# Patient Record
Sex: Female | Born: 1990 | Race: Black or African American
Health system: Southern US, Community
[De-identification: ages and names within clinical notes are randomized; demographics above are authoritative.]

## PROBLEM LIST (undated history)

## (undated) DIAGNOSIS — G43019 Migraine without aura, intractable, without status migrainosus: Principal | ICD-10-CM

## (undated) HISTORY — PX: WISDOM TOOTH EXTRACTION: SHX21

## (undated) HISTORY — DX: Migraine without aura, intractable, without status migrainosus: G43.019

---

## 2012-07-23 DIAGNOSIS — N946 Dysmenorrhea, unspecified: Secondary | ICD-10-CM | POA: Insufficient documentation

## 2012-08-19 DIAGNOSIS — J309 Allergic rhinitis, unspecified: Secondary | ICD-10-CM | POA: Insufficient documentation

## 2012-09-24 DIAGNOSIS — R635 Abnormal weight gain: Secondary | ICD-10-CM | POA: Insufficient documentation

## 2012-09-24 DIAGNOSIS — E669 Obesity, unspecified: Secondary | ICD-10-CM | POA: Insufficient documentation

## 2015-04-13 ENCOUNTER — Encounter (HOSPITAL_COMMUNITY): Payer: Self-pay

## 2015-04-13 ENCOUNTER — Emergency Department (HOSPITAL_COMMUNITY)
Admission: EM | Admit: 2015-04-13 | Discharge: 2015-04-13 | Disposition: A | Discharge status: Home / Self Care | Payer: Managed Care, Other (non HMO) | Attending: Emergency Medicine | Admitting: Emergency Medicine

## 2015-04-13 DIAGNOSIS — R519 Headache, unspecified: Secondary | ICD-10-CM

## 2015-04-13 DIAGNOSIS — Z79899 Other long term (current) drug therapy: Secondary | ICD-10-CM | POA: Insufficient documentation

## 2015-04-13 DIAGNOSIS — R42 Dizziness and giddiness: Secondary | ICD-10-CM | POA: Diagnosis not present

## 2015-04-13 DIAGNOSIS — R51 Headache: Secondary | ICD-10-CM | POA: Insufficient documentation

## 2015-04-13 DIAGNOSIS — R110 Nausea: Secondary | ICD-10-CM | POA: Diagnosis not present

## 2015-04-13 DIAGNOSIS — Z3202 Encounter for pregnancy test, result negative: Secondary | ICD-10-CM | POA: Diagnosis not present

## 2015-04-13 LAB — I-STAT BETA HCG BLOOD, ED (MC, WL, AP ONLY): I-stat hCG, quantitative: <5 m[IU]/mL (ref <5)

## 2015-04-13 MED ORDER — PROCHLORPERAZINE MALEATE 10 MG PO TABS: 10.0000 mg | ORAL_TABLET | Freq: Two times a day (BID) | ORAL | Status: DC | PRN

## 2015-04-13 MED ORDER — MAGNESIUM SULFATE 2 GM/50ML IV SOLN
2.0000 g | Freq: Once | INTRAVENOUS | Status: AC
  Administered 2015-04-13: 2 g via INTRAVENOUS
  Filled 2015-04-13: qty 50

## 2015-04-13 MED ORDER — PROCHLORPERAZINE EDISYLATE 5 MG/ML IJ SOLN
10.0000 mg | Freq: Once | INTRAMUSCULAR | Status: AC
  Administered 2015-04-13: 10 mg via INTRAVENOUS
  Filled 2015-04-13: qty 2

## 2015-04-13 MED ORDER — METHYLPREDNISOLONE SODIUM SUCC 125 MG IJ SOLR
125.0000 mg | Freq: Once | INTRAMUSCULAR | Status: AC
  Administered 2015-04-13: 125 mg via INTRAVENOUS
  Filled 2015-04-13: qty 2

## 2015-04-13 MED ORDER — SODIUM CHLORIDE 0.9 % IV BOLUS (SEPSIS)
1000.0000 mL | Freq: Once | INTRAVENOUS | Status: AC
  Administered 2015-04-13: 1000 mL via INTRAVENOUS

## 2015-04-13 NOTE — ED Notes (Addendum)
Since Monday, pt has had headache.  No hx of migraines.  Pt has been nauseated.  Using ibuprofen with no relief.  Not sleeping.  Pt went to urgent care and told the things she would recommend haven't work so told her to come here.

## 2015-04-13 NOTE — ED Provider Notes (Signed)
CSN: [id number]     Arrival date & time 04/13/15  1112 History   First MD Initiated Contact with Patient 04/13/15 1633     Chief Complaint  Patient presents with  . Headache     (Consider location/radiation/quality/duration/timing/severity/associated sxs/prior Treatment) HPI   Blood pressure 130/104, pulse 70, temperature 98.4 F (36.9 C), temperature source Oral, resp. rate 20, last menstrual period 04/06/2015, SpO2 97 %.  Joan Lee is a 25 y.o. female complaining of eadache for the last two days. HA is a constant, right-sided, throbbing pain. Pain radiates to right ear/jaw. Admits to phonophobia, nausea, dizziness and worse pain with movement. Denies photophobia or vomiting. Pt has a history of mild headaches that improve immediately with ibuprofen and rest. She states this episode feels similar, but more severe and did not dissipate with ibuprofen.  Family history of migraines. Pt denies fever, rash, confusion, cervicalgia, LOC/syncope, change in vision,numbness, weakness, dysarthria, ataxia, thunderclap onset, exacerbation with exertion or valsalva, exacerbation in morning, CP, SOB, abdominal pain.   History reviewed. No pertinent past medical history. History reviewed. No pertinent past surgical history. History reviewed. No pertinent family history. Social History  Substance Use Topics  . Smoking status: Never Smoker   . Smokeless tobacco: None  . Alcohol Use: No   OB History    No data available     Review of Systems  10 systems reviewed and found to be negative, except as noted in the HPI.   Allergies  Review of patient's allergies indicates no known allergies.  Home Medications   Prior to Admission medications   Medication Sig Start Date End Date Taking? Authorizing Provider  IBUPROFEN PO Take by mouth every 6 (six) hours as needed (headache).   Yes Historical Provider, MD  TRI-SPRINTEC 0.18/0.215/0.25 MG-35 MCG tablet Take 1 tablet by mouth daily. 04/09/15   Yes Historical Provider, MD   BP 130/104 mmHg  Pulse 70  Temp(Src) 98.4 F (36.9 C) (Oral)  Resp 20  SpO2 97%  LMP 04/06/2015 Physical Exam  Constitutional: She is oriented to person, place, and time. She appears well-developed and well-nourished.  HENT:  Head: Normocephalic and atraumatic.  Mouth/Throat: Oropharynx is clear and moist.  Eyes: Conjunctivae and EOM are normal. Pupils are equal, round, and reactive to light.  No TTP of maxillary or frontal sinuses  No TTP or induration of temporal arteries bilaterally  Neck: Normal range of motion. Neck supple.  FROM to C-spine. Pt can touch chin to chest without discomfort. No TTP of midline cervical spine.   Cardiovascular: Normal rate, regular rhythm and intact distal pulses.   Pulmonary/Chest: Effort normal and breath sounds normal. No respiratory distress. She has no wheezes. She has no rales. She exhibits no tenderness.  Abdominal: Soft. Bowel sounds are normal. There is no tenderness.  Musculoskeletal: Normal range of motion. She exhibits no edema or tenderness.  Neurological: She is alert and oriented to person, place, and time. No cranial nerve deficit.  II-Visual fields grossly intact. III/IV/VI-Extraocular movements intact.  Pupils reactive bilaterally. V/VII-Smile symmetric, equal eyebrow raise,  facial sensation intact VIII- Hearing grossly intact IX/X-Normal gag XI-bilateral shoulder shrug XII-midline tongue extension Motor: 5/5 bilaterally with normal tone and bulk Cerebellar: Normal finger-to-nose  and normal heel-to-shin test.   Romberg negative Ambulates with a coordinated gait   Nursing note and vitals reviewed.   ED Course  Procedures (including critical care time) Labs Review Labs Reviewed  I-STAT BETA HCG BLOOD, ED (MC, [facility], AP ONLY)   Imaging  Review No results found. I have personally reviewed and evaluated these images and lab results as part of my medical decision-making.   EKG  Interpretation None      MDM   Final diagnoses:  Acute nonintractable headache, unspecified headache type    Filed Vitals:   04/13/15 1153 04/13/15 1555 04/13/15 1824  BP: 161/92 130/104 123/80  Pulse: 76 70 61  Temp: 98.8 F (37.1 C) 98.4 F (36.9 C) 98.6 F (37 C)  TempSrc: Oral Oral Oral  Resp: SpO2: 100% 97% 100%    Medications  sodium chloride 0.9 % bolus 1,000 mL (1,000 mLs Intravenous New Bag/Given 04/13/15 1831)  magnesium sulfate IVPB 2 g 50 mL (2 g Intravenous New Bag/Given 04/13/15 1832)  prochlorperazine (COMPAZINE) injection 10 mg (10 mg Intravenous Given 04/13/15 1832)  methylPREDNISolone sodium succinate (SOLU-MEDROL) 125 mg/2 mL injection 125 mg (125 mg Intravenous Given 04/13/15 1832)    Joan Lee is 25 y.o. female presenting with HA. Presentation is like pts typical HA except not fully alleviated with ibuprofen. HA non concerning for [facility], ICH, Meningitis, or temporal arteritis. Pt is afebrile with no focal neuro deficits, nuchal rigidity, or change in vision. Pt is to follow up with PCP to discuss prophylactic medication. Pt verbalizes understanding and is agreeable with plan to dc.  Evaluation does not show pathology that would require ongoing emergent intervention or inpatient treatment. Pt is hemodynamically stable and mentating appropriately. Discussed findings and plan with patient/guardian, who agrees with care plan. All questions answered. Return precautions discussed and outpatient follow up given.   New Prescriptions   PROCHLORPERAZINE (COMPAZINE) 10 MG TABLET    Take 1 tablet (10 mg total) by mouth 2 (two) times daily as needed (Headcahe).         Wynetta Emery, PA-C 04/13/15 1915  Azalia Bilis, MD 04/14/15 [phone]

## 2015-04-13 NOTE — Discharge Instructions (Signed)
For pain control please take ibuprofen (also known as Motrin or Advil) 800mg  (this is normally 4 over the counter pills) 3 times a day  for 5 days. Take with food to minimize stomach irritation.  Take Compazine as needed for breakthrough pain.  Do not hesitate to return to the emergency room for any new, worsening or concerning symptoms.  Please obtain primary care using resource guide below. Let them know that you were seen in the emergency room and that they will need to obtain records for further outpatient management.   General Headache Without Cause A headache is pain or discomfort felt around the head or neck area. There are many causes and types of headaches. In some cases, the cause may not be found.  HOME CARE  Managing Pain  Take over-the-counter and prescription medicines only as told by your doctor.  Lie down in a dark, quiet room when you have a headache.  If directed, apply ice to the head and neck area:  Put ice in a plastic bag.  Place a towel between your skin and the bag.  Leave the ice on for 20 minutes, 2-3 times per day.  Use a heating pad or hot shower to apply heat to the head and neck area as told by your doctor.  Keep lights dim if bright lights bother you or make your headaches worse. Eating and Drinking  Eat meals on a regular schedule.  Lessen how much alcohol you drink.  Lessen how much caffeine you drink, or stop drinking caffeine. General Instructions  Keep all follow-up visits as told by your doctor. This is important.  Keep a journal to find out if certain things bring on headaches. For example, write down:  What you eat and drink.  How much sleep you get.  Any change to your diet or medicines.  Relax by getting a massage or doing other relaxing activities.  Lessen stress.  Sit up straight. Do not tighten (tense) your muscles.  Do not use tobacco products. This includes cigarettes, chewing tobacco, or e-cigarettes. If you need  help quitting, ask your doctor.  Exercise regularly as told by your doctor.  Get enough sleep. This often means 7-9 hours of sleep. GET HELP IF:  Your symptoms are not helped by medicine.  You have a headache that feels different than the other headaches.  You feel sick to your stomach (nauseous) or you throw up (vomit).  You have a fever. GET HELP RIGHT AWAY IF:   Your headache becomes really bad.  You keep throwing up.  You have a stiff neck.  You have trouble seeing.  You have trouble speaking.  You have pain in the eye or ear.  Your muscles are weak or you lose muscle control.  You lose your balance or have trouble walking.  You feel like you will pass out (faint) or you pass out.  You have confusion.   This information is not intended to replace advice given to you by your health care provider. Make sure you discuss any questions you have with your health care provider.   Document Released: 11/22/2007 Document Revised: 11/03/2014 Document Reviewed: 06/07/2014 Elsevier Interactive Patient Education 2016 [employer].   Emergency Department Resource Guide 1) Find a Doctor and Pay Out of Pocket Although you won't have to find out who is covered by your insurance plan, it is a good idea to ask around and get recommendations. You will then need to call the office and see if  the doctor you have chosen will accept you as a new patient and what types of options they offer for patients who are self-pay. Some doctors offer discounts or will set up payment plans for their patients who do not have insurance, but you will need to ask so you aren't surprised when you get to your appointment.  2) Contact Your Local Health Department Not all health departments have doctors that can see patients for sick visits, but many do, so it is worth a call to see if yours does. If you don't know where your local health department is, you can check in your phone book. The CDC also has a tool  to help you locate your state's health department, and many state websites also have listings of all of their local health departments.  3) Find a Walk-in Clinic If your illness is not likely to be very severe or complicated, you may want to try a walk in clinic. These are popping up all over the country in pharmacies, drugstores, and shopping centers. They're usually staffed by nurse practitioners or physician assistants that have been trained to treat common illnesses and complaints. They're usually fairly quick and inexpensive. However, if you have serious medical issues or chronic medical problems, these are probably not your best option.  No Primary Care Doctor: - Call Health Connect at  [phone] - they can help you locate a primary care doctor that  accepts your insurance, provides certain services, etc. - Physician Referral Service- [phone]  Chronic Pain Problems: Organization         Address  Phone   Notes  [facility]  [phone] Patients need to be referred by their primary care doctor.   Medication Assistance: Organization         Address  Phone   Notes  [facility] Med[facility] [address] [place], [place] [address] [phone] --Must be a resident of [facility] -- Must have NO insurance coverage whatsoever (no Medicaid/ Medicare, etc.) -- The pt. MUST have a primary care doctor that directs their care regularly and follows them in the community   [facility]ist  [phone]   [employer]  [phone]    Agencies that provide inexpensive medical care: Organization         Address  Phone   Notes  [facility]  [phone]   [facility]    [phone]   [facility] [address] [place], [place] [address] [phone]   [facility] [address] [place], [place] [phone]   Planned Parenthood    [phone]    [facility]    [phone]   [facility]  [address], [place] Phone:  [phone], Fax:  [phone] Hours of Operation:  9 am - 6 pm, M-F.  Also accepts Medicaid/Medicare and self-pay.  [facility]  [address], [place] Phone: [phone], Fax: [phone]. Hours of Operation:  8:30 am - 5:30 pm, M-F.  Also accepts Medicaid and self-pay.  [facility] [place] [address] [place] Phone: [phone]   [facility] [address] Natasha Bence [place], [place] [phone], Ext. [phone] Mondays & Thursdays: 7-9 AM.  First 15 patients are seen on a first come, first serve basis.    Medicaid-accepting [facility] Providers:  Organization         Address  Phone   Notes  [facility] [address], [place] [phone] Also accepts self-pay patients.  [facility] [address] Laurell Josephs [place], [place]  [phone]   [facility] [address], [place] [phone]   [facility] [address] [place] [phone]   Renaye Rakers [address], [place]   [phone] Only accepts [place] patients after they have their name applied to their card.   Self-Pay (no insurance) in [facility]:  Organization         Address  Phone   Notes  Sickle Cell Patients, [facility] [address] [place], [place] [phone]   [facility] [address] [place], [place] [phone]   [facility]  [address], [place] [phone]   [facility]/Dr. Osei-Bonsu  [address] [place] or [address], [place] [phone] Phone number for both [place] and [place] locations is the same.  [facility] [address], [place] [phone]   [facility] [address], [place] or [address] [phone]   [facility] [address] [place] [phone], phone; [phone], fax Sees patients 1st and 3rd Saturday of every month.  Must not qualify for public or private insurance (i.e. Medicaid, Medicare, [place] Health Choice, Veterans' Benefits)  Household income should be no more than 200% of the poverty level The clinic cannot treat you if you are pregnant or think you are pregnant  Sexually transmitted diseases are not treated at the clinic.    Dental Care: Organization         Address  Phone  Notes  [facility] [place] [facility] Department of [facility] [address] [place], [place] [phone] Accepts children up to age 32 who are enrolled in [place] or [place] Health Choice; pregnant women with a Medicaid card; and children who have applied for Medicaid or [place] Health Choice, but were declined, whose parents can pay a reduced fee at time of service.  [facility]  [address], [place] [phone] Accepts children up to age 84 who are enrolled in [place] or [place] Health Choice; pregnant women with a Medicaid card; and children who have applied for Medicaid or [place] Health Choice, but were declined, whose parents can pay a reduced fee at time of service.  [facility]  [address] [place], [place] [phone] Patients are seen by appointment only. Walk-ins are not accepted. [facility] will see patients 55 years of age and older. Monday - Tuesday (8am-5pm) Most Wednesdays (8:30-5pm) $30 per visit, cash only  [facility]  [address], [facility] [phone] Patients are seen by appointment only. Walk-ins are not accepted. [facility] will see patients 17 years of age and older. One Wednesday  Evening (Monthly: Volunteer Based).  $30 per visit, cash only  [employer]  [phone] for adults; Children under age 36, call Graduate Pediatric Dentistry at [phone]. Children aged 42-14, please call [phone] to request a pediatric application.  Dental services  are provided in all areas of dental care including fillings, crowns and bridges, complete and partial dentures, implants, gum treatment, root canals, and extractions. Preventive care is also provided. Treatment is provided to both adults and children. Patients are selected via a lottery and there is often a waiting list.   [facility] [address] [place]  [phone] [email or web address]   [facility] [address] [place], [place] [phone], Ext. [phone] Second and Fourth Thursday of each month, opens at 6:30 AM; Clinic ends at 9 AM.  Patients are seen on a first-come first-served basis, and a limited number are seen during each clinic.   [facility]  [address] Ether Griffins [place], [place] [phone]   Eligibility Requirements You must have lived in [place], [place], or [place] counties for at least the last three months.   You cannot be eligible for state or federal sponsored [place], including [employer], [place], or [employer].   You generally cannot be eligible for healthcare insurance through your employer.    How to apply: Eligibility screenings are held every Tuesday and Wednesday afternoon from 1:00 pm until 4:00 pm. You do not need an appointment for the interview!  [facility] [address] [place], [place] [phone]   [facility] Health Department  [phone]   [facility] Health Department  [phone]   [facility] Health Department  [phone]    Behavioral Health Resources in the Community: Intensive Outpatient Programs Organization         Address  Phone  Notes  [facility] [address] [place], [place] [phone]   [facility] Outpatient [address] [place], [place] [phone]   [facility] [address], [place], [place]  [phone]   [facility] [address],  [place], [place] [phone] or [phone]   Substance Abuse Resources Organization         Address  Phone  Notes  [facility] Drug Services  [phone]   [facility]  [phone]   [facility] [place]  [phone]   Floydene Flock  [phone]   Residential & Outpatient Substance Abuse Program  [phone]   Psychological Services Organization         Address  Phone  Notes  [facility] Behavioral Health  [phone]   [facility] Services  [phone]   [facility] [address], [place] [phone] or [phone]    Mobile Crisis Teams Organization         Address  Phone  Notes  [facility], Mobile Crisis Care Unit  [phone]   [facility]  [address] [place], [place] [phone]   Doristine Locks [address] [place] [phone]    Self-Help/Support Groups Organization         Address  Phone             Notes  [facility] [place] - variety of support groups  336- I7437963 Call for more information  Narcotics Anonymous (NA), [facility] [address], [employer] [place]  2 meetings at this location   Statistician         Address  Phone  Notes  [facility] [address],    [place]  [phone]   [facility]  [address] [place] [address], [place], [place] [phone]  [facility] [address] [place], [place] [phone] Admissions: 8am-3pm M-F  [facility] [address],    [place], [place] [phone]   [facility] [address] [place],  [place], [place] [phone]   [facility] [address]  [place], [place] [phone]   [facility] - Intensive Outpatient [address] Laurell Joseph[address], [place], [place] [phone]   [facility] ([facility] [address] [place]  [place], [place] [phone] or [phone]   [facility] ([facility]) [address], [place], [place] [phone] Accepts Medicaid  [facility] [address]  [place] [phone] Substance Abuse/Addiction Treatment   [facility] Organization         Address  Phone  Notes  [facility]  [phone]   Angie Fava, PhD [address] Ervin Knack [place], [place]   7208444835 or [phone]   [facility]   [address] [place], [place] [phone]   [facility] [address] [place], [place] [phone] Insurance/Medicaid/sponsorship through [facility] and Families [address]                                    [place], [place] [phone] Therapy/tele-psych/case  [facility] [address], [place] [phone]    Dr. Lolly Mustache  [phone]   [facility]  [employer] [facility] Dept. 1) [address] [place] 2) [address] [place] 3)  [address], [place] [phone]  [phone]   [facility] Child Abuse Hotline [phone] or [phone] (After Hours)

## 2015-04-14 ENCOUNTER — Telehealth: Payer: Self-pay | Admitting: *Deleted

## 2015-04-14 NOTE — Telephone Encounter (Signed)
Pharmacy called related to Rx: compazine  tablet prescribed for headahe .Marland KitchenMarland KitchenEDCM clarified with EDP, Rx to be filled as ordered for headache.

## 2015-04-20 ENCOUNTER — Ambulatory Visit (INDEPENDENT_AMBULATORY_CARE_PROVIDER_SITE_OTHER): Payer: Managed Care, Other (non HMO) | Admitting: Neurology

## 2015-04-20 ENCOUNTER — Encounter: Payer: Self-pay | Admitting: Neurology

## 2015-04-20 VITALS — BP 128/87 | HR 82 | Ht 67.5 in | Wt 264.5 lb

## 2015-04-20 DIAGNOSIS — G43019 Migraine without aura, intractable, without status migrainosus: Secondary | ICD-10-CM | POA: Diagnosis not present

## 2015-04-20 HISTORY — DX: Migraine without aura, intractable, without status migrainosus: G43.019

## 2015-04-20 MED ORDER — PREDNISONE 5 MG PO TABS: ORAL_TABLET | ORAL | Status: DC

## 2015-04-20 MED ORDER — RIZATRIPTAN BENZOATE 10 MG PO TABS: 10.0000 mg | ORAL_TABLET | Freq: Three times a day (TID) | ORAL | Status: DC | PRN

## 2015-04-20 MED ORDER — TOPIRAMATE 25 MG PO TABS: ORAL_TABLET | ORAL | Status: DC

## 2015-04-20 NOTE — Progress Notes (Signed)
Reason for visit: Headache  Referring physician: [place]  Joan Lee is a 25 y.o. female  History of present illness:  Joan Lee is a 25 year old right-handed black female with a history of headaches off and on throughout her life, she usually will have 2 or 3 headaches a month, easily controlled with over-the-counter medication such as Advil or Aleve. Ten days ago, the patient converted to having daily headaches that are generalized in nature and throbbing in quality. She may have some nausea, no vomiting with the headache. The patient denies any neck stiffness. She denies any confusion. She does have photophobia, not photophobia with the headache. She has gone to the emergency room, she was given medication that seemed to help temporarily for the headache, she was placed on Compazine for nausea, this did not help. She indicates that her mother also has headache. She denies any focal numbness or weakness of the face, arms, or legs. She denies any balance issues or difficulty controlling the bowels or the bladder. She is sent to this office for an evaluation.  Past Medical History  Diagnosis Date  . Common migraine with intractable migraine 04/20/2015    Past Surgical History  Procedure Laterality Date  . Wisdom tooth extraction      Family History  Problem Relation Age of Onset  . Diabetes Mother   . Migraines Mother   . Bell's palsy Father   . Bell's palsy Sister     Social history:  reports that she has never smoked. She has never used smokeless tobacco. She reports that she does not drink alcohol or use illicit drugs.  Medications:  Prior to Admission medications   Medication Sig Start Date End Date Taking? Authorizing Provider  IBUPROFEN PO Take by mouth every 6 (six) hours as needed (headache).   Yes Historical Provider, MD  TRI-SPRINTEC 0.18/0.215/0.25 MG-35 MCG tablet Take 1 tablet by mouth daily. 04/09/15  Yes Historical Provider, MD  prochlorperazine  (COMPAZINE) 10 MG tablet Take 1 tablet (10 mg total) by mouth 2 (two) times daily as needed (Headcahe). Patient not taking: Reported on 04/20/2015 04/13/15   Joni Reining Pisciotta, PA-C     No Known Allergies  ROS:  Out of a complete 14 system review of symptoms, the patient complains only of the following symptoms, and all other reviewed systems are negative.  Headache, dizziness Insomnia  Blood pressure 128/87, pulse 82, height 5' 7.5" (1.715 m), weight 264 lb 8 oz (119.976 kg), last menstrual period 04/06/2015.  Physical Exam  General: The patient is alert and cooperative at the time of the examination. The patient is moderately obese.  Eyes: Pupils are equal, round, and reactive to light. Discs are flat bilaterally.  Neck: The neck is supple, no carotid bruits are noted.  Respiratory: The respiratory examination is clear.  Cardiovascular: The cardiovascular examination reveals a regular rate and rhythm, no obvious murmurs or rubs are noted.  Neuromuscular: Range of movement of the cervical spine is full. No crepitus is noted in the temporomandibular joints  Skin: Extremities are without significant edema.  Neurologic Exam  Mental status: The patient is alert and oriented x 3 at the time of the examination. The patient has apparent normal recent and remote memory, with an apparently normal attention span and concentration ability.  Cranial nerves: Facial symmetry is present. There is good sensation of the face to pinprick and soft touch bilaterally. The strength of the facial muscles and the muscles to head turning and shoulder shrug  are normal bilaterally. Speech is well enunciated, no aphasia or dysarthria is noted. Extraocular movements are full. Visual fields are full. The tongue is midline, and the patient has symmetric elevation of the soft palate. No obvious hearing deficits are noted.  Motor: The motor testing reveals 5 over 5 strength of all 4 extremities. Good symmetric  motor tone is noted throughout.  Sensory: Sensory testing is intact to pinprick, soft touch, vibration sensation, and position sense on all 4 extremities. No evidence of extinction is noted.  Coordination: Cerebellar testing reveals good finger-nose-finger and heel-to-shin bilaterally.  Gait and station: Gait is normal. Tandem gait is normal. Romberg is negative. No drift is seen.  Reflexes: Deep tendon reflexes are symmetric and normal bilaterally. Toes are downgoing bilaterally.   Assessment/Plan:  1. Common migraine headache, converted migraine  The patient is now having daily headaches that are generalized in nature, throbbing. The patient will be placed on a prednisone Dosepak, 5 mg 6 day pack. Topamax will be started, and she was given a prescription for Maxalt. She will follow-up in 3 months, sooner if needed. She is to contact our office if the headaches continue, we may consider MRI evaluation of the brain. The patient has no primary care physician.  Marlan Palau MD 04/20/2015 7:07 PM  [facility] [address] [place], [place] [address]  Phone [phone] Fax [phone]

## 2015-04-20 NOTE — Patient Instructions (Addendum)
 Topamax (topiramate) is a seizure medication that has an FDA approval for seizures and for migraine headache. Potential side effects of this medication include weight loss, cognitive slowing, tingling in the fingers and toes, and carbonated drinks will taste bad. If any significant side effects are noted on this drug, please contact our office.  Migraine Headache A migraine headache is an intense, throbbing pain on one or both sides of your head. A migraine can last for 30 minutes to several hours. CAUSES  The exact cause of a migraine headache is not always known. However, a migraine may be caused when nerves in the brain become irritated and release chemicals that cause inflammation. This causes pain. Certain things may also trigger migraines, such as:  Alcohol.  Smoking.  Stress.  Menstruation.  Aged cheeses.  Foods or drinks that contain nitrates, glutamate, aspartame, or tyramine.  Lack of sleep.  Chocolate.  Caffeine.  Hunger.  Physical exertion.  Fatigue.  Medicines used to treat chest pain (nitroglycerine), birth control pills, estrogen, and some blood pressure medicines. SIGNS AND SYMPTOMS  Pain on one or both sides of your head.  Pulsating or throbbing pain.  Severe pain that prevents daily activities.  Pain that is aggravated by any physical activity.  Nausea, vomiting, or both.  Dizziness.  Pain with exposure to bright lights, loud noises, or activity.  General sensitivity to bright lights, loud noises, or smells. Before you get a migraine, you may get warning signs that a migraine is coming (aura). An aura may include:  Seeing flashing lights.  Seeing bright spots, halos, or zigzag lines.  Having tunnel vision or blurred vision.  Having feelings of numbness or tingling.  Having trouble talking.  Having muscle weakness. DIAGNOSIS  A migraine headache is often diagnosed based on:  Symptoms.  Physical exam.  A CT scan or MRI of your  head. These imaging tests cannot diagnose migraines, but they can help rule out other causes of headaches. TREATMENT Medicines may be given for pain and nausea. Medicines can also be given to help prevent recurrent migraines.  HOME CARE INSTRUCTIONS  Only take over-the-counter or prescription medicines for pain or discomfort as directed by your health care provider. The use of long-term narcotics is not recommended.  Lie down in a dark, quiet room when you have a migraine.  Keep a journal to find out what may trigger your migraine headaches. For example, write down:  What you eat and drink.  How much sleep you get.  Any change to your diet or medicines.  Limit alcohol consumption.  Quit smoking if you smoke.  Get 7-9 hours of sleep, or as recommended by your health care provider.  Limit stress.  Keep lights dim if bright lights bother you and make your migraines worse. SEEK IMMEDIATE MEDICAL CARE IF:   Your migraine becomes severe.  You have a fever.  You have a stiff neck.  You have vision loss.  You have muscular weakness or loss of muscle control.  You start losing your balance or have trouble walking.  You feel faint or pass out.  You have severe symptoms that are different from your first symptoms. MAKE SURE YOU:   Understand these instructions.  Will watch your condition.  Will get help right away if you are not doing well or get worse.   This information is not intended to replace advice given to you by your health care provider. Make sure you discuss any questions you have with your   health care provider.   Document Released: 02/12/2005 Document Revised: 03/05/2014 Document Reviewed: 10/20/2012 Elsevier Interactive Patient Education 2016 Elsevier Inc.  

## 2015-04-30 ENCOUNTER — Telehealth: Payer: Self-pay | Admitting: Neurology

## 2015-04-30 NOTE — Telephone Encounter (Signed)
Patient called. Thursday night took two topamax and since then she is haivng chest pain, throat itchy, coughing, chest tightness, warm fluid moving through her chest and a sharp pain an the last time she took it was Thursday night. Told her this is not a common side effect and she should go the ED or urgent care and be evaluated for an URI or flu or other cause.

## 2015-05-02 ENCOUNTER — Encounter (HOSPITAL_COMMUNITY): Payer: Self-pay | Admitting: Family Medicine

## 2015-05-02 ENCOUNTER — Emergency Department (HOSPITAL_COMMUNITY)
Admission: EM | Admit: 2015-05-02 | Discharge: 2015-05-02 | Disposition: A | Discharge status: Home / Self Care | Payer: Managed Care, Other (non HMO) | Attending: Emergency Medicine | Admitting: Emergency Medicine

## 2015-05-02 DIAGNOSIS — Z79899 Other long term (current) drug therapy: Secondary | ICD-10-CM | POA: Insufficient documentation

## 2015-05-02 DIAGNOSIS — J359 Chronic disease of tonsils and adenoids, unspecified: Secondary | ICD-10-CM | POA: Diagnosis not present

## 2015-05-02 DIAGNOSIS — Z8679 Personal history of other diseases of the circulatory system: Secondary | ICD-10-CM | POA: Insufficient documentation

## 2015-05-02 DIAGNOSIS — R079 Chest pain, unspecified: Secondary | ICD-10-CM | POA: Insufficient documentation

## 2015-05-02 DIAGNOSIS — R350 Frequency of micturition: Secondary | ICD-10-CM | POA: Insufficient documentation

## 2015-05-02 DIAGNOSIS — R1310 Dysphagia, unspecified: Secondary | ICD-10-CM | POA: Insufficient documentation

## 2015-05-02 DIAGNOSIS — J029 Acute pharyngitis, unspecified: Secondary | ICD-10-CM | POA: Insufficient documentation

## 2015-05-02 NOTE — Discharge Instructions (Signed)
STOP TAKING TAMIFLU AND WATCH FOR IMPROVEMENT IN SYMPTOMS. IF THERE IS WORSENING SENSATION OF DIFFICULTY SWALLOWING, TAKE BENADRYL (25-50 MG), AND RETURN TO THE EMERGENCY DEPARTMENT. IF SYMPTOMS PERSIST, FOLLOW UP WITH YOUR DOCTOR FOR FURTHER MANAGEMENT.

## 2015-05-02 NOTE — ED Notes (Addendum)
Patient reports she is experiencing an allergic reaction, having a burning in her chest and "at times she feels like her throat is closing up". Pt reports she started taking Bactrim and Tam-flu at 14:30 yesterday. At 17:00, she developed the previous symptoms. Pt is having regular, unlabored breathing. Talking in full sentences and able to swallow her salvia.

## 2015-05-02 NOTE — ED Provider Notes (Signed)
CSN: [id number]     Arrival date & time 05/02/15  0123 History   First MD Initiated Contact with Patient 05/02/15 0158     Chief Complaint  Patient presents with  . Allergic Reaction     (Consider location/radiation/quality/duration/timing/severity/associated sxs/prior Treatment) HPI Comments: Here with right sided chest pain x 1 day. She also complains of "throat closing" sensation. Seen by Urgent Care yesterday morning for evaluation of urinary frequency and fever, and diagnosed with UTI. She obtained a Rx for Bactrim for infection. She was also given Tamiflu for flu-like symptoms including sore throat (negative flu test and strep test at Urgent Care). Took a dose of each medication around 2:30 pm yesterday with current symptoms of chest discomfort and throat fullness starting around 5:00. She took a second dose around 9:00 with subsequent worsening of symptoms. No rash, vomiting, syncope, cough, persistent fever. She is able to manage her own secretions as well as swallow solids and liquids without difficulty. No voice change.   Patient is a 25 y.o. female presenting with allergic reaction. The history is provided by the patient. No language interpreter was used.  Allergic Reaction Presenting symptoms: no rash     Past Medical History  Diagnosis Date  . Common migraine with intractable migraine 04/20/2015   Past Surgical History  Procedure Laterality Date  . Wisdom tooth extraction     Family History  Problem Relation Age of Onset  . Diabetes Mother   . Migraines Mother   . Bell's palsy Father   . Bell's palsy Sister    Social History  Substance Use Topics  . Smoking status: Never Smoker   . Smokeless tobacco: Never Used  . Alcohol Use: No   OB History    No data available     Review of Systems  Constitutional: Negative for fever.  HENT: Positive for sore throat. Negative for congestion.        See HPI.  Respiratory: Negative for cough.   Cardiovascular: Positive for  chest pain.  Gastrointestinal: Negative for nausea, vomiting and abdominal pain.  Genitourinary: Positive for frequency (symptoms of urinary frequency have improved since visit to Urgent Care and tx of UTI.).  Musculoskeletal: Negative for neck stiffness.  Skin: Negative for rash.  Neurological: Negative for syncope and headaches.      Allergies  Review of patient's allergies indicates no known allergies.  Home Medications   Prior to Admission medications   Medication Sig Start Date End Date Taking? Authorizing Provider  ibuprofen (ADVIL,MOTRIN) 200 MG tablet Take 400 mg by mouth every 6 (six) hours as needed for headache, mild pain or moderate pain.   Yes Historical Provider, MD  oseltamivir (TAMIFLU) 75 MG capsule Take 75 mg by mouth 2 (two) times daily. 05/01/15  Yes Historical Provider, MD  sulfamethoxazole-trimethoprim (BACTRIM DS,SEPTRA DS) 800-160 MG tablet Take 1 tablet by mouth 2 (two) times daily. 05/01/15  Yes Historical Provider, MD  TRI-SPRINTEC 0.18/0.215/0.25 MG-35 MCG tablet Take 1 tablet by mouth daily. 04/09/15  Yes Historical Provider, MD  predniSONE (DELTASONE) 5 MG tablet Begin taking 6 tablets daily, taper by one tablet daily until off the medication. Patient not taking: Reported on 05/02/2015 04/20/15   York Spanielharles K Willis, MD  prochlorperazine (COMPAZINE) 10 MG tablet Take 1 tablet (10 mg total) by mouth 2 (two) times daily as needed (Headcahe). Patient not taking: Reported on 04/20/2015 04/13/15   Joni ReiningNicole Pisciotta, PA-C  rizatriptan (MAXALT) 10 MG tablet Take 1 tablet (10 mg total) by mouth  3 (three) times daily as needed for migraine. Patient not taking: Reported on 05/02/2015 04/20/15   York Spaniel, MD  topiramate (TOPAMAX) 25 MG tablet Take one tablet at night for one week, then take 2 tablets at night for one week, then take 3 tablets at night. Patient not taking: Reported on 05/02/2015 04/20/15   York Spaniel, MD   BP 120/75 mmHg  Pulse 94  Temp(Src) 99.5 F (37.5  C) (Oral)  Resp 16  Ht  (1.702 m)  Wt 113.399 kg  BMI 39.15 kg/m2  SpO2 99%  LMP 04/06/2015 Physical Exam  Constitutional: She is oriented to person, place, and time. She appears well-developed and well-nourished.  HENT:  Head: Normocephalic.  Left tonsillith present. No exudates. No swelling or erythema. Uvula midline.   Neck: Normal range of motion. Neck supple. No tracheal deviation present. No thyromegaly present.  Cardiovascular: Normal rate and regular rhythm.   No murmur heard. Pulmonary/Chest: Effort normal and breath sounds normal.  Abdominal: Soft. Bowel sounds are normal. There is no tenderness. There is no rebound and no guarding.  Musculoskeletal: Normal range of motion.  Lymphadenopathy:    She has no cervical adenopathy.  Neurological: She is alert and oriented to person, place, and time.  Skin: Skin is warm and dry. No rash noted.  Psychiatric: She has a normal mood and affect.    ED Course  Procedures (including critical care time) Labs Review Labs Reviewed - No data to display  Imaging Review No results found. I have personally reviewed and evaluated these images and lab results as part of my medical decision-making.   EKG Interpretation None      MDM   Final diagnoses:  None    1. Dysphagia 2. Chest pain  The patient is swallowing without difficulty. No oropharyngeal compromise visualized. No neck swelling or tenderness. VSS, no hypoxia, wheezing, decreased air flow or physical exam finding of airway compromise.   The patient was started on 2 medications yesterday with symptoms after taking them. Suggested she stop taking Tamiflu as, with negative testing, this is felt unnecessary. She will watch for symptom improvement. If there is worsening she will return here. Persistent symptoms will be evaluated by PCP for further treatment.    Elpidio Anis, PA-C 05/02/15 1610  Arby Barrette, MD 05/09/15 [phone]

## 2015-05-12 NOTE — Telephone Encounter (Signed)
error 

## 2015-07-14 ENCOUNTER — Telehealth: Payer: Self-pay | Admitting: *Deleted

## 2015-07-14 NOTE — Telephone Encounter (Signed)
------------------------------------------------------------ ------------------------------------------------------------   Joan PeersCaller [name]            CID [id number]  Patient SAME                 Pt's Dr UNSURE       Area Code 703 Phone# [phone] * DOB [phone]     RE CXL 230PM 5/22 APPT                                                                                    Disp:Y/N N If Y = C/B If No Response In 20minutes ============================================================

## 2015-07-18 ENCOUNTER — Ambulatory Visit: Payer: Managed Care, Other (non HMO) | Admitting: Adult Health

## 2015-09-13 ENCOUNTER — Telehealth: Payer: Self-pay | Admitting: Behavioral Health

## 2015-09-13 ENCOUNTER — Encounter: Payer: Self-pay | Admitting: Behavioral Health

## 2015-09-13 NOTE — Telephone Encounter (Signed)
Pre-Visit Call completed with patient and chart updated.   Pre-Visit Info documented in Specialty Comments under SnapShot.    

## 2015-09-14 ENCOUNTER — Encounter: Payer: Self-pay | Admitting: Family Medicine

## 2015-09-14 ENCOUNTER — Ambulatory Visit (INDEPENDENT_AMBULATORY_CARE_PROVIDER_SITE_OTHER): Payer: Managed Care, Other (non HMO) | Admitting: Family Medicine

## 2015-09-14 VITALS — BP 117/76 | HR 73 | Temp 99.1°F | Ht 68.5 in | Wt 257.2 lb

## 2015-09-14 DIAGNOSIS — Z23 Encounter for immunization: Secondary | ICD-10-CM

## 2015-09-14 DIAGNOSIS — Z3009 Encounter for other general counseling and advice on contraception: Secondary | ICD-10-CM

## 2015-09-14 DIAGNOSIS — Z309 Encounter for contraceptive management, unspecified: Secondary | ICD-10-CM

## 2015-09-14 MED ORDER — LEVONORGESTREL-ETHINYL ESTRAD 0.1-20 MG-MCG PO TABS: 1.0000 | ORAL_TABLET | Freq: Every day | ORAL | Status: DC

## 2015-09-14 NOTE — Patient Instructions (Signed)
You got your first garadsil shot today- you will also need a shot in approx 2 and then 6 months from today to complete the series.  We can do your pap and also your 2nd shot in 2 months if you like.    It is fine to use contraceptive pills continuously (that is, to skip the placebo pills at the end of the pack).  You can generally take 2-3 packs continuously and then take the placebo pills and have a bleed, as follows:  Take one active tablet for 42 consecutive days or 63 consecutive days (2- 3 packs) followed by four to seven pill-free days. During the pill-free days, the woman typically will have withdrawal bleeding. This regimen can be repeated as long as the woman wants to continue taking oral contraceptive pills

## 2015-09-14 NOTE — Progress Notes (Signed)
[facility] [address], [address] [place], [place] [address] [phone]  Date:  09/14/2015   Name:  Joan Lee   DOB:  1990/07/27   MRN:  [id number]  PCP:  Abbe Amsterdam, MD    Chief Complaint: Establish Care   History of Present Illness:  Joan Lee is a 25 y.o. very pleasant female patient who presents with the following:  She graduated from [employer] college last year.  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  She is planning to go back to grad school to get her teaching license.  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    She does have a history of migraine- this just started a few months ago and she was on treatment with topamax. She has not had any recent HA.  She had been on maxlat and topamax but is not taking these any longer.  She does not get aura when she has her HA  She is on OCP for menstrual regulation.  She and her partner also use condoms  Never a smoker  She had her wisdom teeth removed in January- otherwise no major past medical history She does not have any OB/GYN.   She has never had a pap.  She recently become sexually active.  She does not want to do a pap today but will do soon.   She has not had Gardasil yet and would like to do so  Her LMP was at the end of June- 6/28 approx She is interested in continuous OCP use to have less frequent menses.    Patient Active Problem List   Diagnosis Date Noted  . Common migraine with intractable migraine 04/20/2015    Past Medical History  Diagnosis Date  . Common migraine with intractable migraine 04/20/2015    Past Surgical History  Procedure Laterality Date  . Wisdom tooth extraction      Social History  Substance Use Topics  . Smoking status: Never Smoker   . Smokeless tobacco: Never Used  . Alcohol Use: No    Family History  Problem Relation Age of Onset  . Diabetes Mother   . Migraines Mother   . Bell's palsy  Father   . Bell's palsy Sister     Allergies  Allergen Reactions  . Tamiflu [Oseltamivir Phosphate] Shortness Of Breath    Medication list has been reviewed and updated.  Current Outpatient Prescriptions on File Prior to Visit  Medication Sig Dispense Refill  . ibuprofen (ADVIL,MOTRIN) 200 MG tablet Take 400 mg by mouth every 6 (six) hours as needed for headache, mild pain or moderate pain. Reported on 09/13/2015    . TRI-SPRINTEC 0.18/0.215/0.25 MG-35 MCG tablet Take 1 tablet by mouth daily. Reported on 09/14/2015     No current facility-administered medications on file prior to visit.    Review of Systems:  As per HPI- otherwise negative.   Physical Examination: Filed Vitals:   09/14/15 1625  BP: 117/76  Pulse: 73  Temp: 99.1 F (37.3 C)   Filed Vitals:   09/14/15 1625  Height: 5' 8.5" (1.74 m)  Weight: 257 lb 3.2 oz (116.665 kg)   Body mass index is 38.53 kg/(m^2). Ideal Body Weight: Weight in (lb) to have BMI = 25: 166.5  GEN: WDWN, NAD, Non-toxic, A & O x 3, obese, looks well HEENT: Atraumatic, Normocephalic. Neck supple. No masses, No LAD. Ears and Nose: No  external deformity. CV: RRR, No M/G/R. No JVD. No thrill. No extra heart sounds. PULM: CTA B, no wheezes, crackles, rhonchi. No retractions. No resp. distress. No accessory muscle use. EXTR: No c/c/e NEURO Normal gait.  PSYCH: Normally interactive. Conversant. Not depressed or anxious appearing.  Calm demeanor.    Assessment and Plan: Counseling for birth control, oral contraceptives - Plan: levonorgestrel-ethinyl estradiol (AVIANE,ALESSE,LESSINA) 0.1-20 MG-MCG tablet, DISCONTINUED: levonorgestrel-ethinyl estradiol (AVIANE,ALESSE,LESSINA) 0.1-20 MG-MCG tablet  Immunization due - Plan: HPV 9-valent vaccine,Recombinat  Needs a pap- will do at a followup visit gardasil #1 today Discussed and advised regarding continuous OCP use.  Also encouraged condoms for disease protection and to ensure  contraception  See patient instructions for more details.    Signed Abbe AmsterdamJessica Krystle Oberman, MD

## 2015-09-14 NOTE — Progress Notes (Signed)
Pre visit review using our clinic review tool, if applicable. No additional management support is needed unless otherwise documented below in the visit note. 

## 2015-10-06 ENCOUNTER — Telehealth: Payer: Self-pay | Admitting: Family Medicine

## 2015-10-06 NOTE — Telephone Encounter (Signed)
[facility] Day - Client TELEPHONE ADVICE RECORD [facility] Patient Name: Joan Lee DOB: 08-12-1990 Initial Comment Caller states have questions about birth control. Nurse Assessment Nurse: Ladona RidgelGaddy, RN, Felicia Date/Time (Eastern Time): 10/06/2015 4:41:38 PM Confirm and document reason for call. If symptomatic, describe symptoms. You must click the next button to save text entered. ---PT was put on new birth control July 20th - Aviane. She was given option of not having a period and skipping her medication the last week of month. Is it normal that she spotted earlier? She is on her last week - the placebo pill. She had spotting 2 hours ago - just a little. Normally when she gets her period it is light. No fever. She shouldve got period July 26th - but she was skipping that bc on Aviane.Has the patient traveled out of the country within the last 30 days? ---Not Applicable Does the patient have any new or worsening symptoms? ---No Please document clinical information provided and list any resource used. ---Take one pill every day, no more than 24 hours apart. When the pills run out, start a new pack the following day. Get your prescription refilled before you run out of pills completely. The 28 day birth control pack contains seven "reminder" pills to keep you on your regular cycle. Your period will usually begin while you are using these reminder pills. You may have breakthrough bleeding, especially during the first 3 months. Tell your doctor if this bleeding continues or is very heavy.referenced: https:// www.drugs.com/aviane.htm - she also has a f/u in Sept with MD - no symptoms that are not NL today Guidelines Guideline Title Affirmed Question Affirmed Notes Final Disposition User Clinical Call Gaddy, RN, FeliciaComments clarification: she is actually taking the pills for the last week as well (rather than skipping the "reminder" pills  )- the literature in drugs.com says if she does that she would have some evidence of a Period  Call Id: [phone]

## 2015-10-07 NOTE — Telephone Encounter (Signed)
Called her but no answer, no VM.  Will try her back to see if she has any other questions

## 2015-10-10 NOTE — Telephone Encounter (Signed)
Called her back- went over OCP use and what to expect, answered all questions for her

## 2015-11-16 ENCOUNTER — Encounter: Payer: Self-pay | Admitting: Family Medicine

## 2015-11-16 ENCOUNTER — Ambulatory Visit (INDEPENDENT_AMBULATORY_CARE_PROVIDER_SITE_OTHER): Payer: Managed Care, Other (non HMO) | Admitting: Family Medicine

## 2015-11-16 VITALS — BP 102/82 | HR 86 | Temp 99.3°F | Ht 68.5 in | Wt 261.4 lb

## 2015-11-16 DIAGNOSIS — N926 Irregular menstruation, unspecified: Secondary | ICD-10-CM

## 2015-11-16 DIAGNOSIS — Z23 Encounter for immunization: Secondary | ICD-10-CM | POA: Diagnosis not present

## 2015-11-16 NOTE — Progress Notes (Signed)
[facility] [address], [address] [place], [place] [phone]  Date:  11/16/2015   Name:  Joan Lee   DOB:  Jul 20, 1990   MRN:  [id number]  PCP:  Abbe AmsterdamOPLAND,JESSICA, MD    Chief Complaint: Follow-up (Pt here for two month follow up. Pt had irregular cycle for Sept. Took home pregnancy test yestereday which was neg. Pt started birth control last visit, wondering if change in cycle may be due to starting new bc pills.  Due for second Gardasil. )   History of Present Illness:  Joan Grandchildsannee Dorette GrateGaspard is a 25 y.o. very pleasant female patient who presents with the following:  Seen by myself in June.  She has been trying to use continuous OCP to decrease menstrual symptoms.   She took a week off of OCP and had a bleed in August, and has taken her OCP continuously since then.  Yesterday she started to have a little spotting.  This is her 2nd continuous pack and she has about 10 days left to go.  She is wearing a tampon but has noted just a tiny amount of bleeding.   She took a home HCG yesterday that was negative.    She did take some ibuprofen last night because she was having some mild cramping.   This is now resolved and she feels well No nausea, vomiting or fever.   No urinary sx  She had her 1st Gardasil 2 months ago ago- would like to do her 2nd one today Flu shot today also She does not want to do her pap today as she is bleeding a bit No new sexual partners recently    Patient Active Problem List   Diagnosis Date Noted  . Common migraine with intractable migraine 04/20/2015    Past Medical History:  Diagnosis Date  . Common migraine with intractable migraine 04/20/2015    Past Surgical History:  Procedure Laterality Date  . WISDOM TOOTH EXTRACTION      Social History  Substance Use Topics  . Smoking status: Never Smoker  . Smokeless tobacco: Never Used  . Alcohol use No    Family History  Problem  Relation Age of Onset  . Diabetes Mother   . Migraines Mother   . Bell's palsy Father   . Bell's palsy Sister     Allergies  Allergen Reactions  . Tamiflu [Oseltamivir Phosphate] Shortness Of Breath    Medication list has been reviewed and updated.  Current Outpatient Prescriptions on File Prior to Visit  Medication Sig Dispense Refill  . ibuprofen (ADVIL,MOTRIN) 200 MG tablet Take 400 mg by mouth every 6 (six) hours as needed for headache, mild pain or moderate pain. Reported on 09/13/2015    . levonorgestrel-ethinyl estradiol (AVIANE,ALESSE,LESSINA) 0.1-20 MG-MCG tablet Take 1 tablet by mouth daily. May take active pills continuously for 42 or 63 days 3 Package 3   No current facility-administered medications on file prior to visit.     Review of Systems:  As per HPI- otherwise negative.   Physical Examination: Vitals:   11/16/15 1637  BP: 102/82  Pulse: 86  Temp: 99.3 F (37.4 C)   Vitals:   11/16/15 1637  Weight: 261 lb 6.4 oz (118.6 kg)  Height: 5' 8.5" (1.74 m)   Body mass index is 39.17 kg/m. Ideal Body Weight: Weight in (lb) to have BMI = 25: 166.5  GEN: WDWN, NAD, Non-toxic, A & O x 3, looks  well, obese HEENT: Atraumatic, Normocephalic. Neck supple. No masses, No LAD. Ears and Nose: No external deformity. CV: RRR, No M/G/R. No JVD. No thrill. No extra heart sounds. PULM: CTA B, no wheezes, crackles, rhonchi. No retractions. No resp. distress. No accessory muscle use. ABD: S, NT, ND. No rebound. No HSM.  Belly is quite benign EXTR: No c/c/e NEURO Normal gait.  PSYCH: Normally interactive. Conversant. Not depressed or anxious appearing.  Calm demeanor.   Negative POC HCG today Assessment and Plan: Irregular menses - Plan: POCT urine pregnancy  Immunization due - Plan: HPV 9-valent vaccine,Recombinat  Encounter for immunization - Plan: Flu Vaccine QUAD 36+ mos IM  Counseled that some spotting while using continuous OCP is not unusual- she can  continue to try and use her pills this way.  If bleeding becomes a nuisance she can go back to having a bleed every month If any other concerning sx please let me know Gardasil #2 today- next one in 4 months Flu shot today   Signed Abbe Amsterdam, MD

## 2015-11-16 NOTE — Patient Instructions (Signed)
It was good to see you today- you got your 2nd gardasil and your flu shot today I think you are just having some breakthrough bleeding- continue taking your birth control pill and take the 7 day break at the end of this pack to have a bleed.  You can continue to try and take 2 months of continuous pills- however if you have a lot of breakthrough bleeding this may not work as well for you. There are some women who have a difficult time getting through more than one month without spotting We can plan to do your pap the next time we see you

## 2015-11-17 LAB — POCT URINE PREGNANCY: PREG TEST UR: NEGATIVE

## 2015-12-13 ENCOUNTER — Telehealth: Payer: Self-pay | Admitting: Family Medicine

## 2015-12-13 NOTE — Telephone Encounter (Signed)
Patient is scheduled to see you on 12/14/15 @9 :15

## 2015-12-13 NOTE — Telephone Encounter (Signed)
error 

## 2015-12-13 NOTE — Telephone Encounter (Signed)
Called her back- she is having some back aches and other vague discomforts. She will come and see me soon

## 2015-12-13 NOTE — Telephone Encounter (Signed)
Caller name: Relationship to patient: Self Can be reached: [phone] Pharmacy:  Reason for call:Patient would like to speak with her provider about some symptoms she has been having. Would not give details

## 2015-12-14 ENCOUNTER — Encounter: Payer: Self-pay | Admitting: Family Medicine

## 2015-12-14 ENCOUNTER — Ambulatory Visit (INDEPENDENT_AMBULATORY_CARE_PROVIDER_SITE_OTHER): Payer: Managed Care, Other (non HMO) | Admitting: Family Medicine

## 2015-12-14 ENCOUNTER — Other Ambulatory Visit (HOSPITAL_COMMUNITY)
Admission: RE | Admit: 2015-12-14 | Discharge: 2015-12-14 | Disposition: A | Discharge status: Home / Self Care | Payer: Managed Care, Other (non HMO) | Source: Ambulatory Visit | Attending: Family Medicine | Admitting: Family Medicine

## 2015-12-14 VITALS — BP 124/79 | HR 81 | Temp 98.9°F | Ht 68.5 in | Wt 261.4 lb

## 2015-12-14 DIAGNOSIS — Z01419 Encounter for gynecological examination (general) (routine) without abnormal findings: Secondary | ICD-10-CM | POA: Diagnosis not present

## 2015-12-14 DIAGNOSIS — Z113 Encounter for screening for infections with a predominantly sexual mode of transmission: Secondary | ICD-10-CM | POA: Insufficient documentation

## 2015-12-14 DIAGNOSIS — Z124 Encounter for screening for malignant neoplasm of cervix: Secondary | ICD-10-CM

## 2015-12-14 DIAGNOSIS — M545 Low back pain, unspecified: Secondary | ICD-10-CM

## 2015-12-14 DIAGNOSIS — R109 Unspecified abdominal pain: Secondary | ICD-10-CM | POA: Diagnosis not present

## 2015-12-14 LAB — POCT URINALYSIS DIP (MANUAL ENTRY)
Bilirubin, UA: NEGATIVE
Glucose, UA: NEGATIVE
Ketones, POC UA: NEGATIVE
Leukocytes, UA: NEGATIVE
NITRITE UA: NEGATIVE
PH UA: 6
Protein Ur, POC: NEGATIVE
RBC UA: NEGATIVE
Spec Grav, UA: >=1.03
UROBILINOGEN UA: NEGATIVE

## 2015-12-14 LAB — COMPREHENSIVE METABOLIC PANEL
ALBUMIN: 4.1 g/dL (ref 3.5–5.2)
ALK PHOS: 60 U/L (ref 39–117)
ALT: 10 U/L (ref 0–35)
AST: 14 U/L (ref 0–37)
BUN: 9 mg/dL (ref 6–23)
CHLORIDE: 106 meq/L (ref 96–112)
CO2: 24 mEq/L (ref 19–32)
CREATININE: 0.8 mg/dL (ref 0.40–1.20)
Calcium: 9.5 mg/dL (ref 8.4–10.5)
GFR: 113.96 mL/min (ref >60.00)
Glucose, Bld: 73 mg/dL (ref 70–99)
Potassium: 4 mEq/L (ref 3.5–5.1)
SODIUM: 138 meq/L (ref 135–145)
TOTAL PROTEIN: 7.6 g/dL (ref 6.0–8.3)
Total Bilirubin: 0.3 mg/dL (ref 0.2–1.2)

## 2015-12-14 LAB — CBC
HCT: 41.5 % (ref 36.0–46.0)
Hemoglobin: 13.8 g/dL (ref 12.0–15.0)
MCHC: 33.2 g/dL (ref 30.0–36.0)
MCV: 89.7 fl (ref 78.0–100.0)
Platelets: 299 10*3/uL (ref 150.0–400.0)
RBC: 4.62 Mil/uL (ref 3.87–5.11)
RDW: 13.1 % (ref 11.5–15.5)
WBC: 8.2 10*3/uL (ref 4.0–10.5)

## 2015-12-14 LAB — POCT URINE PREGNANCY: PREG TEST UR: NEGATIVE

## 2015-12-14 NOTE — Progress Notes (Signed)
Pre visit review using our clinic review tool, if applicable. No additional management support is needed unless otherwise documented below in the visit note. 

## 2015-12-14 NOTE — Patient Instructions (Addendum)
I will be in touch with your pap results asap Go ahead and continue your birth control pill- hopefully this month you will have just a light period!    We will check your labs today- assuming that your blood count is normal, we can plan to do an ultrasound to look for an ovarian cyst if your abdominal discomfort does not go away.

## 2015-12-14 NOTE — Progress Notes (Signed)
[facility] at [facility] [address], [address] [place], [place] [address] [phone]  Date:  12/14/2015   Name:  Joan Lee   DOB:  07-Jun-1990   MRN:  [id number]  PCP:  Abbe Amsterdam, MD    Chief Complaint: Back Pain (Pt is having some back aches and other vague discomforts. )   History of Present Illness:  Joan Lee is a 25 y.o. very pleasant female patient who presents with the following:  Last seen by myself about a month ago for irregular bleeding- she had been trying to use continuous OCP and was having spotting and some cramping during active pills Called yesterday due to sx of not feeling quite well, back pain, and her mom was concerned about her having fibroids so she came in  At the time of our last visit she bled for about 2 weeks total.  She has noted a lower back ache and sharp pains off an on, "my stomach has been feeling weird," not nauseated. Her nipples have been a bit sore. Her left lower back is more painful, she does not have any numbness or tingling, no pain into her leg  She has one more week of active OCP and then will be expecting a bleed.  She has maintained without breakthrough bleeding since her last period Her mom got worried about fibroids and wanted her to checked out  She has never had a pap, would like to do this today She is SA.    No pain with urination No vomiting or diarrhea.   No fever, chills or cough.  No unusual vaginal symptoms or discharge.  On exam noted mild RLQ tenderness-on questioning pt has noted this for "about one week," it is not severe and is not getting worse   Patient Active Problem List   Diagnosis Date Noted  . Common migraine with intractable migraine 04/20/2015    Past Medical History:  Diagnosis Date  . Common migraine with intractable migraine 04/20/2015    Past Surgical History:  Procedure Laterality Date  . WISDOM TOOTH EXTRACTION      Social History   Substance Use Topics  . Smoking status: Never Smoker  . Smokeless tobacco: Never Used  . Alcohol use No    Family History  Problem Relation Age of Onset  . Diabetes Mother   . Migraines Mother   . Bell's palsy Father   . Bell's palsy Sister     Allergies  Allergen Reactions  . Tamiflu [Oseltamivir Phosphate] Shortness Of Breath    Medication list has been reviewed and updated.  Current Outpatient Prescriptions on File Prior to Visit  Medication Sig Dispense Refill  . ibuprofen (ADVIL,MOTRIN) 200 MG tablet Take 400 mg by mouth every 6 (six) hours as needed for headache, mild pain or moderate pain. Reported on 09/13/2015    . levonorgestrel-ethinyl estradiol (AVIANE,ALESSE,LESSINA) 0.1-20 MG-MCG tablet Take 1 tablet by mouth daily. May take active pills continuously for 42 or 63 days 3 Package 3   No current facility-administered medications on file prior to visit.     Review of Systems:  As per HPI- otherwise negative. Eating normally   Physical Examination: Vitals:   12/14/15 0929  BP: 124/79  Pulse: 81  Temp: 98.9 F (37.2 C)    Vitals:   12/14/15 0929  Weight: 261 lb 6.4 oz (118.6 kg)   Body mass index is 39.17 kg/m. Ideal Body Weight:    GEN: WDWN, NAD,  Non-toxic, A & O x 3, obese, otherwise looks well HEENT: Atraumatic, Normocephalic. Neck supple. No masses, No LAD. Ears and Nose: No external deformity. CV: RRR, No M/G/R. No JVD. No thrill. No extra heart sounds. PULM: CTA B, no wheezes, crackles, rhonchi. No retractions. No resp. distress. No accessory muscle use. ABD: S, ND, +BS. No rebound. No HSM.  Minimal RLQ tenderness to deep palpation EXTR: No c/c/e NEURO Normal gait.  PSYCH: Normally interactive. Conversant. Not depressed or anxious appearing.  Calm demeanor.  Pelvic: normal, no vaginal lesions or discharge. Uterus normal, no CMT, no adnexal tendereness or masses  Results for orders placed or performed in visit on 12/14/15  CBC  Result  Value Ref Range   WBC 8.2 4.0 - 10.5 K/uL   RBC 4.62 3.87 - 5.11 Mil/uL   Platelets 299.0 150.0 - 400.0 K/uL   Hemoglobin 13.8 12.0 - 15.0 g/dL   HCT 16.141.5 09.636.0 - 04.546.0 %   MCV 89.7 78.0 - 100.0 fl   MCHC 33.2 30.0 - 36.0 g/dL   RDW 40.913.1 81.111.5 - 91.415.5 %  Comprehensive metabolic panel  Result Value Ref Range   Sodium 138 135 - 145 mEq/L   Potassium 4.0 3.5 - 5.1 mEq/L   Chloride 106 96 - 112 mEq/L   CO2 24 19 - 32 mEq/L   Glucose, Bld 73 70 - 99 mg/dL   BUN 9 6 - 23 mg/dL   Creatinine, Ser 7.820.80 0.40 - 1.20 mg/dL   Total Bilirubin 0.3 0.2 - 1.2 mg/dL   Alkaline Phosphatase 60 39 - 117 U/L   AST 14 0 - 37 U/L   ALT 10 0 - 35 U/L   Total Protein 7.6 6.0 - 8.3 g/dL   Albumin 4.1 3.5 - 5.2 g/dL   Calcium 9.5 8.4 - 95.610.5 mg/dL   GFR 213.08113.96 >65.78>60.00 mL/min  POCT urine pregnancy  Result Value Ref Range   Preg Test, Ur Negative Negative  POCT urinalysis dipstick  Result Value Ref Range   Color, UA yellow yellow   Clarity, UA clear clear   Glucose, UA negative negative   Bilirubin, UA negative negative   Ketones, POC UA negative negative   Spec Grav, UA >=1.030    Blood, UA negative negative   pH, UA 6.0    Protein Ur, POC negative negative   Urobilinogen, UA negative    Nitrite, UA Negative Negative   Leukocytes, UA Negative Negative     Assessment and Plan: Acute left-sided low back pain without sciatica - Plan: POCT urine pregnancy, POCT urinalysis dipstick  Screening for cervical cancer - Plan: Cytology - PAP  Abdominal discomfort - Plan: CBC, Comprehensive metabolic panel  Here today with concern of needing a pap and back pain.  Her mother has fibroids and wanted her to be seen in case she has these as well.  Discussed with pt- I am glad to order an US although significant fibroids would be less likely in her age.  Her urine does not look infected- suspect back pain is MSK in origin She does have minimal RLQ tenderness of a weeks duration on exam.  However she does not have  an exam c/w acute appendicitis or other emergency etiology.  Will check a CBC- if she has leukocytosis plan for ct.   If normal will observe closely with the understanding that she needs to seek care if she gets worse or has any change in her sx.  If sx persist as they are now  will plan for [place] to look for a cyst. Pt states understanding and agreement with plan  Signed Abbe Amsterdam, MD

## 2015-12-16 LAB — CYTOLOGY - PAP
CHLAMYDIA, DNA PROBE: NEGATIVE
Diagnosis: NEGATIVE
NEISSERIA GONORRHEA: NEGATIVE

## 2015-12-23 ENCOUNTER — Encounter: Payer: Self-pay | Admitting: Family Medicine

## 2016-02-04 ENCOUNTER — Encounter: Payer: Self-pay | Admitting: Family Medicine

## 2016-03-16 ENCOUNTER — Telehealth: Payer: Self-pay | Admitting: Family Medicine

## 2016-03-16 NOTE — Telephone Encounter (Signed)
Patient is calling because she was told she has a balance. She stated that billing was not able to even pull her account up by name or dob. I can see that she has a $17.79 balance but I don't know how to look up what it is from. She is curious as to why she has this balance. She stated she will pay it at her next appt but would like to know what it is from. Please advise

## 2016-03-19 ENCOUNTER — Encounter: Payer: Self-pay | Admitting: Family Medicine

## 2016-03-19 ENCOUNTER — Ambulatory Visit (INDEPENDENT_AMBULATORY_CARE_PROVIDER_SITE_OTHER): Payer: Managed Care, Other (non HMO) | Admitting: Family Medicine

## 2016-03-19 VITALS — BP 138/86 | HR 88 | Temp 98.7°F | Ht 68.5 in | Wt 265.2 lb

## 2016-03-19 DIAGNOSIS — Z23 Encounter for immunization: Secondary | ICD-10-CM

## 2016-03-19 NOTE — Progress Notes (Signed)
[facility] [address] [place], [place] [address] [phone]  Date:  03/19/2016   Name:  Joan Lee   DOB:  1990/05/06   MRN:  [id number]  PCP:  Abbe AmsterdamOPLAND,Brittley Regner, MD    Chief Complaint: Follow-up (Pt here for 4 month follow up. )   History of Present Illness:  Joan Lee is a 26 y.o. very pleasant female patient who presents with the following:  Here today for a follow-up visit.  As it turns out she just needs her last Gardasil shot  She is on OCP and is doing better with the 30 day pack- having less breakthrough bleeding. She still will sometimes start to spot during the last week of active bills but this is getting better   Patient Active Problem List   Diagnosis Date Noted  . Common migraine with intractable migraine 04/20/2015    Past Medical History:  Diagnosis Date  . Common migraine with intractable migraine 04/20/2015    Past Surgical History:  Procedure Laterality Date  . WISDOM TOOTH EXTRACTION      Social History  Substance Use Topics  . Smoking status: Never Smoker  . Smokeless tobacco: Never Used  . Alcohol use No    Family History  Problem Relation Age of Onset  . Diabetes Mother   . Migraines Mother   . Bell's palsy Father   . Bell's palsy Sister     Allergies  Allergen Reactions  . Tamiflu [Oseltamivir Phosphate] Shortness Of Breath    Medication list has been reviewed and updated.  Current Outpatient Prescriptions on File Prior to Visit  Medication Sig Dispense Refill  . ibuprofen (ADVIL,MOTRIN) 200 MG tablet Take 400 mg by mouth every 6 (six) hours as needed for headache, mild pain or moderate pain. Reported on 09/13/2015    . levonorgestrel-ethinyl estradiol (AVIANE,ALESSE,LESSINA) 0.1-20 MG-MCG tablet Take 1 tablet by mouth daily. May take active pills continuously for 42 or 63 days 3 Package 3   No current facility-administered medications on file prior to  visit.     Review of Systems:  As per HPI- otherwise negative.   Physical Examination: Vitals:   03/19/16 1405  BP: 138/86  Pulse: 88  Temp: 98.7 F (37.1 C)   Vitals:   03/19/16 1405  Weight: 265 lb 3.2 oz (120.3 kg)  Height: 5' 8.5" (1.74 m)   Body mass index is 39.74 kg/m. Ideal Body Weight: Weight in (lb) to have BMI = 25: 166.5   GEN: WDWN, NAD, Non-toxic, Alert & Oriented x 3, obese, looks well HEENT: Atraumatic, Normocephalic.  Ears and Nose: No external deformity. EXTR: No clubbing/cyanosis/edema NEURO: Normal gait.  PSYCH: Normally interactive. Conversant. Not depressed or anxious appearing.  Calm demeanor.    Assessment and Plan: Immunization due - Plan: HPV 9-valent vaccine,Recombinat  3rd gardasil today No other concerns   Signed Abbe AmsterdamJessica Josemiguel Gries, MD

## 2016-03-19 NOTE — Progress Notes (Signed)
Pre visit review using our clinic review tool, if applicable. No additional management support is needed unless otherwise documented below in the visit note. 

## 2016-05-14 ENCOUNTER — Encounter: Payer: Self-pay | Admitting: Family Medicine

## 2016-06-11 ENCOUNTER — Encounter: Payer: Self-pay | Admitting: Family Medicine

## 2016-06-11 NOTE — Telephone Encounter (Signed)
Patient checking on the status of My Chart message and would like PCP to call via phone [phone] instead of responding thru My Chart.

## 2016-06-12 ENCOUNTER — Encounter: Payer: Self-pay | Admitting: Gastroenterology

## 2016-07-04 ENCOUNTER — Encounter: Payer: Self-pay | Admitting: Family Medicine

## 2016-07-05 ENCOUNTER — Encounter: Payer: Self-pay | Admitting: Family Medicine

## 2016-07-05 ENCOUNTER — Other Ambulatory Visit: Payer: Self-pay | Admitting: Emergency Medicine

## 2016-07-05 ENCOUNTER — Encounter: Payer: Self-pay | Admitting: Emergency Medicine

## 2016-07-05 DIAGNOSIS — Z3009 Encounter for other general counseling and advice on contraception: Secondary | ICD-10-CM

## 2016-07-05 MED ORDER — LEVONORGESTREL-ETHINYL ESTRAD 0.1-20 MG-MCG PO TABS: 1.0000 | ORAL_TABLET | Freq: Every day | ORAL | 0 refills | Status: DC

## 2016-07-10 ENCOUNTER — Ambulatory Visit: Payer: Managed Care, Other (non HMO) | Admitting: Gastroenterology

## 2016-08-05 ENCOUNTER — Encounter (HOSPITAL_COMMUNITY): Payer: Self-pay

## 2016-08-05 DIAGNOSIS — N10 Acute tubulo-interstitial nephritis: Secondary | ICD-10-CM | POA: Insufficient documentation

## 2016-08-05 DIAGNOSIS — Z793 Long term (current) use of hormonal contraceptives: Secondary | ICD-10-CM | POA: Insufficient documentation

## 2016-08-05 DIAGNOSIS — R1030 Lower abdominal pain, unspecified: Secondary | ICD-10-CM | POA: Insufficient documentation

## 2016-08-05 DIAGNOSIS — Z79899 Other long term (current) drug therapy: Secondary | ICD-10-CM | POA: Diagnosis not present

## 2016-08-05 DIAGNOSIS — M545 Low back pain: Secondary | ICD-10-CM | POA: Diagnosis present

## 2016-08-05 LAB — URINALYSIS, ROUTINE W REFLEX MICROSCOPIC
Bilirubin Urine: NEGATIVE
Glucose, UA: NEGATIVE mg/dL
Hgb urine dipstick: NEGATIVE
Ketones, ur: NEGATIVE mg/dL
Leukocytes, UA: NEGATIVE
Nitrite: POSITIVE — AB
Protein, ur: NEGATIVE mg/dL
Specific Gravity, Urine: 1.015 (ref 1.005–1.030)
pH: 5 (ref 5.0–8.0)

## 2016-08-05 LAB — POC URINE PREG, ED: Preg Test, Ur: NEGATIVE

## 2016-08-05 NOTE — ED Triage Notes (Signed)
[place] seen in [place] treated for a UTI states now pain is worse now no nausea or vomiting.

## 2016-08-05 NOTE — ED Notes (Signed)
Pt diagnosed with a UTI and pylonephritis last night and put on antibiotics. Pt c/o 10/10 abdominal pain.

## 2016-08-06 ENCOUNTER — Emergency Department (HOSPITAL_COMMUNITY)
Admission: EM | Admit: 2016-08-06 | Discharge: 2016-08-06 | Disposition: A | Discharge status: Home / Self Care | Payer: Managed Care, Other (non HMO) | Attending: Emergency Medicine | Admitting: Emergency Medicine

## 2016-08-06 ENCOUNTER — Emergency Department (HOSPITAL_COMMUNITY): Payer: Managed Care, Other (non HMO)

## 2016-08-06 DIAGNOSIS — R109 Unspecified abdominal pain: Secondary | ICD-10-CM

## 2016-08-06 DIAGNOSIS — N12 Tubulo-interstitial nephritis, not specified as acute or chronic: Secondary | ICD-10-CM

## 2016-08-06 LAB — CBC
HEMATOCRIT: 40.1 % (ref 36.0–46.0)
Hemoglobin: 13.8 g/dL (ref 12.0–15.0)
MCH: 29.9 pg (ref 26.0–34.0)
MCHC: 34.4 g/dL (ref 30.0–36.0)
MCV: 87 fL (ref 78.0–100.0)
Platelets: 308 10*3/uL (ref 150–400)
RBC: 4.61 MIL/uL (ref 3.87–5.11)
RDW: 12.3 % (ref 11.5–15.5)
WBC: 10 10*3/uL (ref 4.0–10.5)

## 2016-08-06 LAB — BASIC METABOLIC PANEL
ANION GAP: 8 (ref 5–15)
BUN: 11 mg/dL (ref 6–20)
CO2: 24 mmol/L (ref 22–32)
Calcium: 9.1 mg/dL (ref 8.9–10.3)
Chloride: 108 mmol/L (ref 101–111)
Creatinine, Ser: 0.85 mg/dL (ref 0.44–1.00)
GFR calc Af Amer: >60 mL/min (ref >60)
GFR calc non Af Amer: >60 mL/min (ref >60)
GLUCOSE: 103 mg/dL — AB (ref 65–99)
POTASSIUM: 3.7 mmol/L (ref 3.5–5.1)
Sodium: 140 mmol/L (ref 135–145)

## 2016-08-06 MED ORDER — KETOROLAC TROMETHAMINE 30 MG/ML IJ SOLN
30.0000 mg | Freq: Once | INTRAMUSCULAR | Status: AC
  Administered 2016-08-06: 30 mg via INTRAVENOUS
  Filled 2016-08-06: qty 1

## 2016-08-06 MED ORDER — IBUPROFEN 800 MG PO TABS: 800.0000 mg | ORAL_TABLET | Freq: Three times a day (TID) | ORAL | 0 refills | Status: DC | PRN

## 2016-08-06 MED ORDER — SODIUM CHLORIDE 0.9 % IV BOLUS (SEPSIS)
1000.0000 mL | Freq: Once | INTRAVENOUS | Status: AC
  Administered 2016-08-06: 1000 mL via INTRAVENOUS

## 2016-08-06 MED ORDER — TRAMADOL HCL 50 MG PO TABS: 50.0000 mg | ORAL_TABLET | Freq: Four times a day (QID) | ORAL | 0 refills | Status: DC | PRN

## 2016-08-06 NOTE — Discharge Instructions (Signed)
Return here as needed. Increase your fluid intake. Your CT scan did not show any abnormality at this time.

## 2016-08-06 NOTE — ED Provider Notes (Signed)
[facility]-EMERGENCY DEPT Provider Note   CSN: [id number] Arrival date & time: 08/05/16  2255     History   Chief Complaint Chief Complaint  Patient presents with  . Flank Pain    HPI Joan Lee is a 26 y.o. female.  HPI Patient presents to the emergency department with back pain and abdominal discomfort that started earlier this morning.  Patient was seen in the ER yesterday in [place].  Patient was diagnosed with a kidney infection and placed on antibiotics.  She states that she has been taking the antibiotics.  She states that nothing seems make the condition better or worse.  She states that she did not take any medications other than the ones prescribed prior to arrival. The patient denies chest pain, shortness of breath, headache,blurred vision, neck pain, fever, cough, weakness, numbness, dizziness, anorexia, edema, abdominal pain, nausea, vomiting, diarrhea, rash, hematemesis, bloody stool, near syncope, or syncope. Past Medical History:  Diagnosis Date  . Common migraine with intractable migraine 04/20/2015    Patient Active Problem List   Diagnosis Date Noted  . Common migraine with intractable migraine 04/20/2015    Past Surgical History:  Procedure Laterality Date  . WISDOM TOOTH EXTRACTION      OB History    No data available       Home Medications    Prior to Admission medications   Medication Sig Start Date End Date Taking? Authorizing Provider  doxepin (SINEQUAN) 100 MG capsule Take 100 mg by mouth every 12 (twelve) hours.   Yes [provider]  levonorgestrel-ethinyl estradiol (AVIANE,ALESSE,LESSINA) 0.1-20 MG-MCG tablet Take 1 tablet by mouth daily. May take active pills continuously for 42 or 63 days 07/05/16  Yes Copland, Gwenlyn Found, MD  phenazopyridine (PYRIDIUM) 200 MG tablet Take 200 mg by mouth 3 (three) times daily as needed for pain.   Yes [provider]  ibuprofen (ADVIL,MOTRIN) 800 MG tablet Take 1 tablet (800 mg total) by  mouth every 8 (eight) hours as needed. 08/06/16   Samariyah Cowles, Cristal Deer, PA-C  traMADol (ULTRAM) 50 MG tablet Take 1 tablet (50 mg total) by mouth every 6 (six) hours as needed for severe pain. 08/06/16   Charlestine Night, PA-C    Family History Family History  Problem Relation Age of Onset  . Diabetes Mother   . Migraines Mother   . Bell's palsy Father   . Bell's palsy Sister     Social History Social History  Substance Use Topics  . Smoking status: Never Smoker  . Smokeless tobacco: Never Used  . Alcohol use No     Allergies   Tamiflu [oseltamivir phosphate]   Review of Systems Review of Systems All other systems negative except as documented in the HPI. All pertinent positives and negatives as reviewed in the HPI.  Physical Exam Updated Vital Signs BP 114/88 (BP Location: Right Arm)   Pulse 69   Temp 98.2 F (36.8 C) (Oral)   Resp 18   Ht 5\' 8"  (1.727 m)   Wt 127 kg (280 lb)   SpO2 100%   BMI 42.57 kg/m   Physical Exam  Constitutional: She is oriented to person, place, and time. She appears well-developed and well-nourished. No distress.  HENT:  Head: Normocephalic and atraumatic.  Mouth/Throat: Oropharynx is clear and moist.  Eyes: Pupils are equal, round, and reactive to light.  Neck: Normal range of motion. Neck supple.  Cardiovascular: Normal rate, regular rhythm and normal heart sounds.  Exam reveals no gallop and no  friction rub.   No murmur heard. Pulmonary/Chest: Effort normal and breath sounds normal. No respiratory distress. She has no wheezes.  Abdominal: Soft. Bowel sounds are normal. She exhibits no distension. There is no tenderness.  Neurological: She is alert and oriented to person, place, and time. She exhibits normal muscle tone. Coordination normal.  Skin: Skin is warm and dry. Capillary refill takes less than 2 seconds. No rash noted. No erythema.  Psychiatric: She has a normal mood and affect. Her behavior is normal.  Nursing note and  vitals reviewed.    ED Treatments / Results  Labs (all labs ordered are listed, but only abnormal results are displayed) Labs Reviewed  URINALYSIS, ROUTINE W REFLEX MICROSCOPIC - Abnormal; Notable for the following:       Result Value   Color, Urine AMBER (*)    Nitrite POSITIVE (*)    Bacteria, UA RARE (*)    Squamous Epithelial / LPF 0-5 (*)    All other components within normal limits  BASIC METABOLIC PANEL - Abnormal; Notable for the following:    Glucose, Bld 103 (*)    All other components within normal limits  CBC  POC URINE PREG, ED    EKG  EKG Interpretation None       Radiology Ct Renal Stone Study  Result Date: 08/06/2016 CLINICAL DATA:  Acute onset of lower abdominal pressure and left flank pain. Nausea and vomiting. Initial encounter. EXAM: CT ABDOMEN AND PELVIS WITHOUT CONTRAST TECHNIQUE: Multidetector CT imaging of the abdomen and pelvis was performed following the standard protocol without IV contrast. COMPARISON:  None. FINDINGS: Lower chest: The visualized lung bases are grossly clear. The visualized portions of the mediastinum are unremarkable. Hepatobiliary: A 1.8 cm hypodensity is noted at the right hepatic lobe. The liver is otherwise unremarkable. The gallbladder is grossly unremarkable. The common bile duct remains normal in caliber. Pancreas: The pancreas is within normal limits. Spleen: The spleen is unremarkable in appearance. Adrenals/Urinary Tract: The adrenal glands are unremarkable in appearance. The kidneys are within normal limits. There is no evidence of hydronephrosis. No renal or ureteral stones are identified. No perinephric stranding is seen. Stomach/Bowel: The stomach is unremarkable in appearance. The small bowel is within normal limits. The appendix is normal in caliber, without evidence of appendicitis. The colon is unremarkable in appearance. Vascular/Lymphatic: The abdominal aorta is unremarkable in appearance. The inferior vena cava is  grossly unremarkable. No retroperitoneal lymphadenopathy is seen. No pelvic sidewall lymphadenopathy is identified. Reproductive: The bladder is mildly distended and grossly unremarkable. The uterus is unremarkable in appearance. The ovaries are relatively symmetric. No suspicious adnexal masses are seen. A tampon is noted at the vagina. Other: No additional soft tissue abnormalities are seen. Musculoskeletal: No acute osseous abnormalities are identified. The visualized musculature is unremarkable in appearance. IMPRESSION: 1. No acute abnormality seen within the abdomen or pelvis. 2. 1.8 cm hypodensity at the right hepatic lobe likely reflects a cyst. Electronically Signed   By: Roanna Raider M.D.   On: 08/06/2016 04:54    Procedures Procedures (including critical care time)  Medications Ordered in ED Medications  sodium chloride 0.9 % bolus 1,000 mL (1,000 mLs Intravenous New Bag/Given 08/06/16 0536)  ketorolac (TORADOL) 30 MG/ML injection 30 mg (30 mg Intravenous Given 08/06/16 0532)     Initial Impression / Assessment and Plan / ED Course  I have reviewed the triage vital signs and the nursing notes.  Pertinent labs & imaging results that were available during my  care of the patient were reviewed by me and considered in my medical decision making (see chart for details).     The patient is given IV fluids and Toradol.  Patient is advised to continue the antibiotics old.  Increase her fluid intake.  The patient's CT renal stone study was negative for stone.  The concern was with her increasing back pain that this could be a source of pain  Final Clinical Impressions(s) / ED Diagnoses   Final diagnoses:  Flank pain  Pyelonephritis    New Prescriptions New Prescriptions   IBUPROFEN (ADVIL,MOTRIN) 800 MG TABLET    Take 1 tablet (800 mg total) by mouth every 8 (eight) hours as needed.   TRAMADOL (ULTRAM) 50 MG TABLET    Take 1 tablet (50 mg total) by mouth every 6 (six) hours as needed  for severe pain.     Charlestine NightLawyer, Margaretann Abate, PA-C 08/06/16 [id number]    Pricilla LovelessGoldston, Scott, MD 08/08/16 [phone]

## 2016-08-06 NOTE — ED Notes (Signed)
[place] [facility] [place] and was given an antibiotic and pyridium. Patient states that she is feeling more pressure in her lower abdomen which is making her believe that her symptom is getting worse.

## 2016-09-02 NOTE — Progress Notes (Addendum)
[facility] [address] [place], [place] [address] [phone]  Date:  09/03/2016   Name:  Joan Lee   DOB:  01-06-1991   MRN:  [id number]  PCP:  Pearline Cables, MD    Chief Complaint: Annual Exam (Pt here for CPE )   History of Present Illness:  Joan Lee is a 26 y.o. very pleasant female patient who presents with the following:  Here today for a CPE Negative pap last October so not needed today She is on OCP- however she had stopped using this while she was on abx for her pyelo last month, went back on her pills 2-3 weeks ago.  While off her contraception she and her partner did use condoms.  However she is not quite sure of the date of her last menses  Tetanus: she thinks this is UTD, will try to find out for sure from her mom.  We do not have records for her STI screening: pt declines today  She was in there ER last month with a kidney infection  She is fasting today for labs   Her HA have been quiet recently which is good news- she is no longer taking the doxepin for these  She would be interested in seeing a nutritionist to help her eat better and hopefully lose weight Admits that she does tend to eat out a lot.   She does not plan ahead or pack meals very well She is eating fast food for breakfast and lunch.  They do generally cook dinner at home Weight is about 10 lbs up from the start of the year  BP Readings from Last 3 Encounters:  09/03/16 (!) 118/98  08/06/16 114/79  03/19/16 138/86   Wt Readings from Last 3 Encounters:  09/03/16 276 lb 6.4 oz (125.4 kg)  08/05/16 280 lb (127 kg)  03/19/16 265 lb 3.2 oz (120.3 kg)     Patient Active Problem List   Diagnosis Date Noted  . Common migraine with intractable migraine 04/20/2015    Past Medical History:  Diagnosis Date  . Common migraine with intractable migraine 04/20/2015    Past Surgical History:  Procedure Laterality Date   . WISDOM TOOTH EXTRACTION      Social History  Substance Use Topics  . Smoking status: Never Smoker  . Smokeless tobacco: Never Used  . Alcohol use No    Family History  Problem Relation Age of Onset  . Diabetes Mother   . Migraines Mother   . Bell's palsy Father   . Bell's palsy Sister     Allergies  Allergen Reactions  . Tamiflu [Oseltamivir Phosphate] Shortness Of Breath    Medication list has been reviewed and updated.  Current Outpatient Prescriptions on File Prior to Visit  Medication Sig Dispense Refill  . ibuprofen (ADVIL,MOTRIN) 800 MG tablet Take 1 tablet (800 mg total) by mouth every 8 (eight) hours as needed. 21 tablet 0  . phenazopyridine (PYRIDIUM) 200 MG tablet Take 200 mg by mouth 3 (three) times daily as needed for pain.    . traMADol (ULTRAM) 50 MG tablet Take 1 tablet (50 mg total) by mouth every 6 (six) hours as needed for severe pain. 15 tablet 0   No current facility-administered medications on file prior to visit.     Review of Systems:  As per HPI- otherwise negative. No fever or chills No CP or SOB   Physical Examination:  Vitals:   09/03/16 0937  BP: (!) 118/98  Pulse: 80  Temp: 98.3 F (36.8 C)   Vitals:   09/03/16 0937  Weight: 276 lb 6.4 oz (125.4 kg)  Height: 5\' 9"  (1.753 m)   Body mass index is 40.82 kg/m. Ideal Body Weight: Weight in (lb) to have BMI = 25: 168.9  GEN: WDWN, NAD, Non-toxic, A & O x 3, obese, otherwise looks well HEENT: Atraumatic, Normocephalic. Neck supple. No masses, No LAD. Ears and Nose: No external deformity. CV: RRR, No M/G/R. No JVD. No thrill. No extra heart sounds. PULM: CTA B, no wheezes, crackles, rhonchi. No retractions. No resp. distress. No accessory muscle use. ABD: S, NT, ND, +BS. No rebound. No HSM. EXTR: No c/c/e NEURO Normal gait.  PSYCH: Normally interactive. Conversant. Not depressed or anxious appearing.  Calm demeanor.   Results for orders placed or performed in visit on  09/03/16  POCT urine pregnancy  Result Value Ref Range   Preg Test, Ur Negative Negative    Assessment and Plan: Physical exam  Counseling for birth control, oral contraceptives - Plan: levonorgestrel-ethinyl estradiol (AVIANE,ALESSE,LESSINA) 0.1-20 MG-MCG tablet  Irregular menses - Plan: POCT urine pregnancy  Screening for hyperlipidemia - Plan: Lipid panel  Screening for diabetes mellitus - Plan: Hemoglobin A1c  Non morbid obesity, unspecified obesity type - Plan: Amb ref to Medical Nutrition Therapy-MNT  CPE today Advised that while abx can interfere with contraception, she should still continue to take her pills while on abx, just use condoms as well Negative HCG today Labs pending as above Referral to nutrition therapy to help her work out a healthier diet with goal of weight loss  See patient instructions for more details.      Signed Abbe AmsterdamJessica Jaidin Ugarte, MD  Received her labs  Results for orders placed or performed in visit on 09/03/16  Lipid panel  Result Value Ref Range   Cholesterol 185 0 - 200 mg/dL   Triglycerides 16.176.0 0.0 - 149.0 mg/dL   HDL 09.6046.40 >45.40>39.00 mg/dL   VLDL 98.115.2 0.0 - 19.140.0 mg/dL   LDL Cholesterol 478123 (H) 0 - 99 mg/dL   Total CHOL/HDL Ratio 4    NonHDL 138.46   Hemoglobin A1c  Result Value Ref Range   Hgb A1c MFr Bld 5.2 4.6 - 6.5 %  POCT urine pregnancy  Result Value Ref Range   Preg Test, Ur Negative Negative   Message to pt

## 2016-09-03 ENCOUNTER — Ambulatory Visit (INDEPENDENT_AMBULATORY_CARE_PROVIDER_SITE_OTHER): Payer: Managed Care, Other (non HMO) | Admitting: Family Medicine

## 2016-09-03 ENCOUNTER — Encounter: Payer: Self-pay | Admitting: Family Medicine

## 2016-09-03 VITALS — BP 118/80 | HR 80 | Temp 98.3°F | Ht 69.0 in | Wt 276.4 lb

## 2016-09-03 DIAGNOSIS — Z0000 Encounter for general adult medical examination without abnormal findings: Secondary | ICD-10-CM | POA: Diagnosis not present

## 2016-09-03 DIAGNOSIS — E669 Obesity, unspecified: Secondary | ICD-10-CM | POA: Diagnosis not present

## 2016-09-03 DIAGNOSIS — Z3009 Encounter for other general counseling and advice on contraception: Secondary | ICD-10-CM | POA: Diagnosis not present

## 2016-09-03 DIAGNOSIS — N926 Irregular menstruation, unspecified: Secondary | ICD-10-CM | POA: Diagnosis not present

## 2016-09-03 DIAGNOSIS — Z131 Encounter for screening for diabetes mellitus: Secondary | ICD-10-CM

## 2016-09-03 DIAGNOSIS — Z13220 Encounter for screening for lipoid disorders: Secondary | ICD-10-CM

## 2016-09-03 LAB — LIPID PANEL
CHOLESTEROL: 185 mg/dL (ref 0–200)
HDL: 46.4 mg/dL (ref >39.00)
LDL CALC: 123 mg/dL — AB (ref 0–99)
NonHDL: 138.46
Total CHOL/HDL Ratio: 4
Triglycerides: 76 mg/dL (ref 0.0–149.0)
VLDL: 15.2 mg/dL (ref 0.0–40.0)

## 2016-09-03 LAB — POCT URINE PREGNANCY: PREG TEST UR: NEGATIVE

## 2016-09-03 LAB — HEMOGLOBIN A1C: HEMOGLOBIN A1C: 5.2 % (ref 4.6–6.5)

## 2016-09-03 MED ORDER — LEVONORGESTREL-ETHINYL ESTRAD 0.1-20 MG-MCG PO TABS: 1.0000 | ORAL_TABLET | Freq: Every day | ORAL | 3 refills | Status: DC

## 2016-09-03 NOTE — Patient Instructions (Addendum)
It was good to see you again today!  Take care and I will be in touch with your labs asap Continue to take your birth control pill daily.  Let me know if your headaches return  Please do work on your weight through diet and exercise. I am going to refer you to see a nutritionist to help you learn what foods to eat for good health.

## 2016-09-28 ENCOUNTER — Encounter: Payer: Self-pay | Admitting: Family Medicine

## 2016-10-01 ENCOUNTER — Ambulatory Visit (INDEPENDENT_AMBULATORY_CARE_PROVIDER_SITE_OTHER): Payer: Managed Care, Other (non HMO) | Admitting: Family Medicine

## 2016-10-01 ENCOUNTER — Encounter: Payer: Self-pay | Admitting: Family Medicine

## 2016-10-01 DIAGNOSIS — IMO0001 Reserved for inherently not codable concepts without codable children: Secondary | ICD-10-CM

## 2016-10-01 DIAGNOSIS — Z6841 Body Mass Index (BMI) 40.0 and over, adult: Secondary | ICD-10-CM

## 2016-10-01 DIAGNOSIS — E669 Obesity, unspecified: Secondary | ICD-10-CM

## 2016-10-01 NOTE — Patient Instructions (Addendum)
You are currently eating 3 meals a day, a pattern that is great for helping you stay healthy and manage your weight.  Keep this up, but let's pay more attn to what those meals are made up of.    1. Eat at least 3 REAL meals and 1-2 snacks per day.  Aim for no more than 5 hours between eating.  Eat breakfast within one hour of getting up at least 5 days a week.    A REAL meal includes at least some protein, some starch, and vegetables and/or fruit.    Fresh or frozen vegetables are best.   2. Make at least 2 of every three meals per day at home. 3. Walk at least 30 min 3 X week.    - Highly recommended: Spend a few minutes each weekend to look ahead at the week, and at least loosely plan what meals you'll be making this week.

## 2016-10-01 NOTE — Progress Notes (Signed)
Medical Nutrition Therapy:  Appt start time: 1530 end time:  1630. PCP Abbe AmsterdamJessica Copland, MD Boyfriend Jeannett SeniorStephen  Assessment:  Primary concerns today: Weight management.  Melvine was referred by Dr. Patsy Lageropland for weight loss.  She has tried many times on her own to lose weight, but gets frustrated, and reverts back to accustomed eating behaviors.    Shanzay works at [employer] 9 hours 5 X wk, with 2 days off varying from week to week.  She usually buys breakfast on her way to work, and lunch is usually take-out or [employer] food.  Stops for fast food on the way home from work 2-3 times a week.    Learning Readiness: Ready  Usual eating pattern includes 3 meals (only recently)and 2 snacks per day. Frequent foods and beverages include pasta, soda, fast food/takeout (2-3 X wk).  Avoided foods include fish, yogurt.   Usual physical activity includes none.  Has a gym at apt complex, but hasn't used it.    24-hr recall: (Up at 8:30 AM) B (10 AM)-   2-3 bites Hambgr Helper (didn't feel well), 16 oz water Went to work at 11 AM Snk ( AM)-   --- L ( PM)-  Water thru-out day Snk ( PM)-  --- D (8 PM)-  2-3 c Hambgr Helper Snk ( PM)-  --- Typical day? No.  Did not feel well yesterday.  A more typical day includes a [employer] 4-pk of mini-bites, small (~20 oz) lemonade; lunch of Sonic burger, mozz sticks, and ~20 oz sprite, Dinner of chx or meatballs, pasta, canned mixed veg's (carrots, peas, potato, corn).  Usual snacks include occasional chips or candy.    Progress Towards Goal(s):  In progress.   Nutritional Diagnosis:  NI-5.8.3 Inappropriate intake of types of carbohydrates (specify):   As related to soda as well as lack of fruits and vegetables.  As evidenced by usual intake includes 20-40 oz of soda per day and 0-1 veg or fruit.    Intervention:  Nutrition education.   Handouts given during visit include:  AVS  Goals Sheet  Demonstrated degree of understanding via:  Teach Back  Barriers to  learning/adherence to lifestyle change: Longstanding poor eating and exercise behaviors.  Monitoring/Evaluation:  Dietary intake, exercise, and body weight in 4 week(s).

## 2016-11-01 ENCOUNTER — Ambulatory Visit (INDEPENDENT_AMBULATORY_CARE_PROVIDER_SITE_OTHER): Payer: Managed Care, Other (non HMO) | Admitting: Family Medicine

## 2016-11-01 ENCOUNTER — Encounter: Payer: Self-pay | Admitting: Family Medicine

## 2016-11-01 DIAGNOSIS — Z6841 Body Mass Index (BMI) 40.0 and over, adult: Secondary | ICD-10-CM

## 2016-11-01 DIAGNOSIS — IMO0001 Reserved for inherently not codable concepts without codable children: Secondary | ICD-10-CM

## 2016-11-01 DIAGNOSIS — E669 Obesity, unspecified: Secondary | ICD-10-CM

## 2016-11-01 NOTE — Patient Instructions (Addendum)
-   Maintaining exercise motivation:   - Exercise partner.  - Do your walking BEFORE you sit down to relax after work.    - Consider walking at work before you drive home.  Put workout clothes and a towel in the car the night before.  - The 5-minute rule of exercise: Promise yourself you will walk for at least 5 minutes, and will give yourself permission to quit if you really don't want to continue.  Once you have 5 minutes of exercise done, you have completed the HARDEST part of the workout.    Goals:  1. Include at least 2 servings of vegetables per day.  2. Make at least 2 of every three meals per day at home. 3. Walk at least 30 min 3 X week.    Complete Goals Sheet, and bring to follow-up on Dec 27, 2016.

## 2016-11-01 NOTE — Progress Notes (Signed)
Medical Nutrition Therapy:  Appt start time: 1330 end time:  1430. PCP: Abbe AmsterdamJessica Copland, MD Boyfriend: Jeannett SeniorStephen  Assessment:  Primary concerns today: Weight management.  Joan Lee has been eating less fast food.  For lunch she now usually gets a Healthy Choice frozen meal instead of eating out.  Breakfast is usually yogurt and fruit also instead of fast food.  She is also drinking much more water, and hardly any soda.  She is consistently eating 3 meals a day, and virtually all meals are from home.  She admits to struggling with getting veg's; has never really eaten veg's at home, so tends to not even think about them as she is planning meals.    Loredana used her Goals Sheet initially, but lost track of it when she moved recently.    Learning Readiness: Ready  Recent physical activity includes walking 30 min 1-2 X wk, usually with her boyfriend and his dog.  We discussed what she can do to help motivate her to exercise more (see Patient Instrxns).    24-hr recall:  (Up at 8:15 AM; to work at 11 to start at 11:30) B (9 AM)-  12 oz yogurt drink, 2 peaches  Snk ( AM)-  --- L (4 PM)-  1 rst beef (1 slc) sandwith w/ 1 slc Swiss cheese, 1/2 tsp aioli sauce, 1 oz baked chips, water Snk ( PM)-  --- D (9 PM)-  1 1/2 c pasta with chx, garlic bread, water Snk ( PM)-  --- Typical day? Yes.  except that lunch is more often a frozen meal.  Lunch varies from 3:30 to 5 PM on days she starts at 11:30.   Progress Towards Goal(s):  In progress.   Nutritional Diagnosis: Significant progress noted on NI-5.8.3 Inappropriate intake of types of carbohydrates (specify):   As related to beverages.  As evidenced by usual soda intake of only 1-2 per week.    Intervention:  Nutrition education.   Handouts given during visit include:  AVS  Goals Sheet (revised)  Demonstrated degree of understanding via:  Teach Back  Barriers to learning/adherence to lifestyle change: Longstanding poor eating and exercise  behaviors.  Monitoring/Evaluation:  Dietary intake, exercise, and body weight in 8 week(s).

## 2016-11-13 ENCOUNTER — Encounter: Payer: Self-pay | Admitting: Family Medicine

## 2016-11-15 ENCOUNTER — Ambulatory Visit (INDEPENDENT_AMBULATORY_CARE_PROVIDER_SITE_OTHER): Payer: Managed Care, Other (non HMO) | Admitting: Family Medicine

## 2016-11-15 ENCOUNTER — Ambulatory Visit (INDEPENDENT_AMBULATORY_CARE_PROVIDER_SITE_OTHER): Payer: Managed Care, Other (non HMO) | Admitting: Obstetrics & Gynecology

## 2016-11-15 ENCOUNTER — Encounter: Payer: Self-pay | Admitting: Obstetrics & Gynecology

## 2016-11-15 VITALS — BP 132/82 | HR 82 | Ht 67.5 in | Wt 272.0 lb

## 2016-11-15 VITALS — BP 123/82 | HR 97 | Temp 99.5°F | Ht 69.0 in | Wt 271.6 lb

## 2016-11-15 DIAGNOSIS — N764 Abscess of vulva: Secondary | ICD-10-CM

## 2016-11-15 NOTE — Progress Notes (Signed)
[facility] [address] [place], [place] [address] [phone]  Date:  11/15/2016   Name:  Joan Lee   DOB:  10-24-1990   MRN:  [id number]  PCP:  Pearline Cables, MD    Chief Complaint: Abscess (c/o abscess near vaginal, went to Urgent Care on Tuesday, given antibitoics. Pt states she feels the pain from the abscess has worsened. )   History of Present Illness:  Joan Lee is a 26 y.o. very pleasant female patient who presents with the following:  She had contacted me a couple of days ago with concern of a possible abscess in the groin area. She went to UC and was rx abx, but did not have an I and D.  She went to UC on Tuesday night (today is Thursday), was given bactrim which she is taking BID  The area has gotten more painful and is not draining  She has not noted any fever or flu like sx, she otherwise feels ok No vomiting, she is able to eat ok  She has never had this in the past LMP is current  Patient Active Problem List   Diagnosis Date Noted  . Common migraine with intractable migraine 04/20/2015    Past Medical History:  Diagnosis Date  . Common migraine with intractable migraine 04/20/2015    Past Surgical History:  Procedure Laterality Date  . WISDOM TOOTH EXTRACTION      Social History  Substance Use Topics  . Smoking status: Never Smoker  . Smokeless tobacco: Never Used  . Alcohol use No    Family History  Problem Relation Age of Onset  . Diabetes Mother   . Migraines Mother   . Bell's palsy Father   . Bell's palsy Sister     Allergies  Allergen Reactions  . Tamiflu [Oseltamivir Phosphate] Shortness Of Breath    Medication list has been reviewed and updated.  Current Outpatient Prescriptions on File Prior to Visit  Medication Sig Dispense Refill  . ibuprofen (ADVIL,MOTRIN) 800 MG tablet Take 1 tablet (800 mg total) by mouth every 8 (eight) hours as needed. 21  tablet 0  . levonorgestrel-ethinyl estradiol (AVIANE,ALESSE,LESSINA) 0.1-20 MG-MCG tablet Take 1 tablet by mouth daily. May take active pills continuously for 42 or 63 days 3 Package 3   No current facility-administered medications on file prior to visit.     Review of Systems:  As per HPI- otherwise negative.   Physical Examination: Vitals:   11/15/16 1007  BP: 123/82  Pulse: 97  Temp: 99.5 F (37.5 C)  SpO2: 100%   Vitals:   11/15/16 1007  Weight: 271 lb 9.6 oz (123.2 kg)  Height:  (1.753 m)   Body mass index is 40.11 kg/m. Ideal Body Weight: Weight in (lb) to have BMI = 25: 168.9  GEN: WDWN, NAD, Non-toxic, A & O x 3, obese, otherwise looks well HEENT: Atraumatic, Normocephalic. Neck supple. No masses, No LAD. Ears and Nose: No external deformity. CV: RRR, No M/G/R. No JVD. No thrill. No extra heart sounds. PULM: CTA B, no wheezes, crackles, rhonchi. No retractions. No resp. distress. No accessory muscle use. EXTR: No c/c/e NEURO Normal gait.  PSYCH: Normally interactive. Conversant. Not depressed or anxious appearing.  Calm demeanor.  GU: she has a tender and firm area, likely c/w abscess, on the left side just adjacent to the vaginal introitus.  The abscess seems to be deep- I do  not see pointing or other skin signs to indicate superficial pus. No fluctuance.     Assessment and Plan: Left genital labial abscess  Here today with a genital abscess. This abscess seems like it may be deep, and I would prefer her to see someone with surgical skills for drainage. Will refer to [facility] today- appreciate their help with this nice patient   No charge for visit today  Signed Abbe Amsterdam, MD

## 2016-11-15 NOTE — Patient Instructions (Signed)

## 2016-11-15 NOTE — Patient Instructions (Signed)
Please go to the GYN office down the hall and they will help you!    [facility]   [address] [place], [place] [address]  [phone]

## 2016-11-15 NOTE — Progress Notes (Signed)
History:  26 y.o. G0P0000 here today for eval pf perineal abscess. Pt reports sx that started 5 days prev. She reports pain in the perineum . She was seen in the ED and given Bactrim and Motrin. The sx have become worse. She has not noted any drainage.      The following portions of the patient's history were reviewed and updated as appropriate: allergies, current medications, past family history, past medical history, past social history, past surgical history and problem list.  Review of Systems:  Pertinent items are noted in HPI.   Objective:  Physical Exam Blood pressure 132/82, pulse 82, height 5' 7.5" (1.715 m), weight 272 lb (123.4 kg), last menstrual period 11/13/2016. CONSTITUTIONAL: Well-developed, well-nourished female in no acute distress.  HENT:  Normocephalic, atraumatic EYES: Conjunctivae and EOM are normal. No scleral icterus.  NECK: Normal range of motion SKIN: Skin is warm and dry. No rash noted. Not diaphoretic.No pallor. NEUROLGIC: Alert and oriented to person, place, and time. Normal coordination.  Pelvic: external genitalia: on the left side of the labia majus there is a small ~3cm abscess. It is tender but NOT fluctuant.  There is no edema or induration. It is not pointing.  Assessment & Plan:  Vulvar abscess-  Lesion not pointing.  Rec warm compresses 5x/day F/u in 4 days or sooner prn  Total face-to-face time with patient was 15 min.  Greater than 50% was spent in counseling and coordination of care with the patient.     Brendon Christoffel L. Harraway-Smith, M.D., [facility]

## 2016-11-15 NOTE — Progress Notes (Signed)
Patient complaining of left side labial pain since Saturday. Patient was seen in urgent care on Tuesday night and given antibiotic and ibuprofen.Patient states that it has become more painful in last few days. Armandina Stammer RNBSN

## 2016-11-19 ENCOUNTER — Ambulatory Visit (INDEPENDENT_AMBULATORY_CARE_PROVIDER_SITE_OTHER): Payer: Managed Care, Other (non HMO) | Admitting: Obstetrics & Gynecology

## 2016-11-19 ENCOUNTER — Encounter: Payer: Self-pay | Admitting: Obstetrics & Gynecology

## 2016-11-19 VITALS — BP 116/75 | HR 76 | Ht 67.0 in | Wt 272.0 lb

## 2016-11-19 DIAGNOSIS — N764 Abscess of vulva: Secondary | ICD-10-CM | POA: Diagnosis not present

## 2016-11-19 MED ORDER — FLUCONAZOLE 150 MG PO TABS: 150.0000 mg | ORAL_TABLET | Freq: Once | ORAL | 3 refills | Status: AC

## 2016-11-19 NOTE — Progress Notes (Signed)
History:  26 y.o. G0P0000 here today for f/u of vulvar abscess. She reports that the lesion drained she reports that she feels better now. Pt is on her menses but reports some vaginal itching. She is still taking the atbx prescribed at the onset of her dx.    The following portions of the patient's history were reviewed and updated as appropriate: allergies, current medications, past family history, past medical history, past social history, past surgical history and problem list.  Review of Systems:  Pertinent items are noted in HPI.   Objective:  Physical Exam Blood pressure 116/75, pulse 76, height  (1.702 m), weight 272 lb (123.4 kg), last menstrual period 11/13/2016.  CONSTITUTIONAL: Well-developed, well-nourished female in no acute distress.  HENT:  Normocephalic, atraumatic EYES: Conjunctivae and EOM are normal. No scleral icterus.  NECK: Normal range of motion SKIN: Skin is warm and dry. No rash noted. Not diaphoretic.No pallor. NEUROLGIC: Alert and oriented to person, place, and time. Normal coordination.   Pelvic: Normal appearing external genitalia;th eleft labial cyst is still present but, very small and nontender.; not fluctuant.   Assessment & Plan:  vulavar abscess- improved  Total face-to-face time with patient was .  Greater than 50% was spent in counseling and coordination of care with the patient.  F/u 1 year annual or sooner prn  Diflucan  po a day x 1   Alee Katen L. Erin Fulling, M.D., FACOG  .

## 2016-11-19 NOTE — Progress Notes (Signed)
Pt states abscess drained on its own Saturday night. Pt states it is sore and itchy now.

## 2016-12-27 ENCOUNTER — Ambulatory Visit: Payer: Managed Care, Other (non HMO) | Admitting: Family Medicine

## 2017-06-10 ENCOUNTER — Ambulatory Visit: Payer: Managed Care, Other (non HMO) | Admitting: Family Medicine

## 2017-06-14 ENCOUNTER — Encounter: Payer: Self-pay | Admitting: Family Medicine

## 2017-06-17 ENCOUNTER — Ambulatory Visit: Payer: Self-pay | Admitting: Family Medicine

## 2017-06-17 ENCOUNTER — Other Ambulatory Visit: Payer: Self-pay | Admitting: Emergency Medicine

## 2017-06-17 DIAGNOSIS — Z3009 Encounter for other general counseling and advice on contraception: Secondary | ICD-10-CM

## 2017-06-17 MED ORDER — LEVONORGESTREL-ETHINYL ESTRAD 0.1-20 MG-MCG PO TABS: 1.0000 | ORAL_TABLET | Freq: Every day | ORAL | 0 refills | Status: DC

## 2017-08-19 ENCOUNTER — Encounter: Payer: Self-pay | Admitting: Obstetrics & Gynecology

## 2017-08-20 ENCOUNTER — Ambulatory Visit: Payer: BLUE CROSS/BLUE SHIELD

## 2017-08-20 VITALS — BP 134/88 | HR 73

## 2017-08-20 DIAGNOSIS — R350 Frequency of micturition: Secondary | ICD-10-CM | POA: Diagnosis not present

## 2017-08-20 MED ORDER — SULFAMETHOXAZOLE-TRIMETHOPRIM 800-160 MG PO TABS: 1.0000 | ORAL_TABLET | Freq: Two times a day (BID) | ORAL | 0 refills | Status: AC

## 2017-08-20 MED ORDER — PHENAZOPYRIDINE HCL 200 MG PO TABS: 200.0000 mg | ORAL_TABLET | Freq: Three times a day (TID) | ORAL | 0 refills | Status: DC | PRN

## 2017-08-20 NOTE — Progress Notes (Signed)
Patient states she is having urinary frequency. Patient also states that she is having burning senesation at the end of her urine stream. Patient states that last year about this time she had a UTI that progressed into a kidney infection. Will send urine culture today. Joan StammerJennifer Laura-Lee Villegas RN

## 2017-08-22 LAB — URINE CULTURE

## 2017-08-23 NOTE — Progress Notes (Signed)
I have reviewed this chart and agree with the RN/CMA assessment and management.    K. Meryl Kao Berkheimer, M.D. Attending Obstetrician & Gynecologist, [facility]  

## 2017-08-29 ENCOUNTER — Encounter: Payer: Self-pay | Admitting: Obstetrics & Gynecology

## 2017-09-04 ENCOUNTER — Encounter: Payer: Self-pay | Admitting: Obstetrics & Gynecology

## 2017-09-04 ENCOUNTER — Ambulatory Visit (INDEPENDENT_AMBULATORY_CARE_PROVIDER_SITE_OTHER): Payer: BLUE CROSS/BLUE SHIELD | Admitting: Obstetrics & Gynecology

## 2017-09-04 VITALS — BP 125/81 | HR 82 | Ht 68.0 in | Wt 280.0 lb

## 2017-09-04 DIAGNOSIS — R300 Dysuria: Secondary | ICD-10-CM | POA: Diagnosis not present

## 2017-09-04 DIAGNOSIS — N3001 Acute cystitis with hematuria: Secondary | ICD-10-CM

## 2017-09-04 LAB — POCT URINALYSIS DIPSTICK
Bilirubin, UA: NEGATIVE
Glucose, UA: NEGATIVE
KETONES UA: NEGATIVE
NITRITE UA: NEGATIVE
PROTEIN UA: NEGATIVE
RBC UA: POSITIVE

## 2017-09-04 MED ORDER — CEPHALEXIN 500 MG PO CAPS: 500.0000 mg | ORAL_CAPSULE | Freq: Two times a day (BID) | ORAL | 0 refills | Status: DC

## 2017-09-04 NOTE — Progress Notes (Signed)
History:  27 y.o. G0P0000 here today for f/u of UTI.  Pt reports dysuria. She was treated for a UTI for 3 days and her sx resolved but returned after 3 days.  No hematuria reported.      The following portions of the patient's history were reviewed and updated as appropriate: allergies, current medications, past family history, past medical history, past social history, past surgical history and problem list.  Review of Systems:  Pertinent items are noted in HPI.    Objective:  Physical Exam Blood pressure 125/81, pulse 82, height 5\' 8"  (1.727 m), weight 280 lb (127 kg), last menstrual period 08/27/2017.  CONSTITUTIONAL: Well-developed, well-nourished female in no acute distress.  HENT:  Normocephalic, atraumatic EYES: Conjunctivae and EOM are normal. No scleral icterus.  NECK: Normal range of motion SKIN: Skin is warm and dry. No rash noted. Not diaphoretic.No pallor. NEUROLGIC: Alert and oriented to person, place, and time. Normal coordination.  Abd: Soft, nontender and nondistended; supra pubic tenderness.  Pelvic: deferred  Labs and Imaging UA: + blood; + leuks    Assessment & Plan:  1. Dysuria  - POCT Urinalysis Dipstick - Urine Culture - cephALEXin (KEFLEX) 500 MG capsule; Take 1 capsule (500 mg total) by mouth 2 (two) times daily.  Dispense: 14 capsule; Refill: 0  2. Acute cystitis with hematuria  - cephALEXin (KEFLEX) 500 MG capsule; Take 1 capsule (500 mg total) by mouth 2 (two) times daily.  Dispense: 14 capsule; Refill: 0  F/u prn   Meagen Limones L. Harraway-Smith, M.D., Evern CoreFACOG

## 2017-09-04 NOTE — Patient Instructions (Signed)

## 2017-09-04 NOTE — Progress Notes (Signed)
Patient was treated for UTI and had some relief. Once patient started her period she noticed burning when she goes to the restroom. Patient notes her urine is cloudy. Joan StammerJennifer Kamyiah Colantonio RN

## 2017-09-06 ENCOUNTER — Encounter: Payer: Self-pay | Admitting: Obstetrics & Gynecology

## 2017-09-06 LAB — URINE CULTURE

## 2017-09-13 ENCOUNTER — Other Ambulatory Visit: Payer: Self-pay | Admitting: Family Medicine

## 2017-09-13 DIAGNOSIS — Z3009 Encounter for other general counseling and advice on contraception: Secondary | ICD-10-CM

## 2017-09-16 ENCOUNTER — Other Ambulatory Visit: Payer: Self-pay | Admitting: Family Medicine

## 2017-09-16 DIAGNOSIS — Z3009 Encounter for other general counseling and advice on contraception: Secondary | ICD-10-CM

## 2017-09-16 NOTE — Telephone Encounter (Signed)
Patient has not been seen in over 6 months. Please advise on refills.

## 2017-09-17 ENCOUNTER — Telehealth: Payer: Self-pay | Admitting: Family Medicine

## 2017-09-17 DIAGNOSIS — Z3009 Encounter for other general counseling and advice on contraception: Secondary | ICD-10-CM

## 2017-09-17 MED ORDER — LEVONORGESTREL-ETHINYL ESTRAD 0.1-20 MG-MCG PO TABS: 1.0000 | ORAL_TABLET | Freq: Every day | ORAL | 0 refills | Status: DC

## 2017-09-17 NOTE — Telephone Encounter (Signed)
Copied from CRM [id number]. Topic: Quick Communication - See Telephone Encounter >> Sep 17, 2017  2:05 PM Joan Lee, Joan Lee, NT wrote: CRM for notification. See Telephone encounter for: 09/17/17. Patient called and states her RX levonorgestrel-ethinyl estradiol (AVIANE,ALESSE,LESSINA) 0.1-20 MG-MCG [facility] and it was suppose to be sent to [facility]. Please resend   [facility] [place] - [address] [phone] (Phone) [phone] (Fax)

## 2017-09-18 MED ORDER — LEVONORGESTREL-ETHINYL ESTRAD 0.1-20 MG-MCG PO TABS: 1.0000 | ORAL_TABLET | Freq: Every day | ORAL | 0 refills | Status: DC

## 2017-09-19 NOTE — Progress Notes (Addendum)
[facility] [address], [address] [place], [place] [address] [phone]  Date:  09/23/2017   Name:  Joan Lee   DOB:  11-11-1990   MRN:  [id number]  PCP:  Pearline Cables, MD    Chief Complaint: Annual Exam (no concerns)   History of Present Illness:  Joan Lee is a 27 y.o. very pleasant female patient who presents with the following:  Here today for a CPE  Pap: 10/17- normal. Pt wishes to repeat next year  Labs: a year ago- will do today  Immun:  May need Tdap, we will do for her today    From our last visit about a year ago: Here today for a CPE Negative pap last October so not needed today She is on OCP- however she had stopped using this while she was on abx for her pyelo last month, went back on her pills 2-3 weeks ago.  While off her contraception she and her partner did use condoms.  However she is not quite sure of the date of her last menses  Tetanus: she thinks this is UTD, will try to find out for sure from her mom.  We do not have records for her STI screening: pt declines today  Her HA have been quiet recently which is good news- she is no longer taking the doxepin for these  She would be interested in seeing a nutritionist to help her eat better and hopefully lose weight Admits that she does tend to eat out a lot.   She does not plan ahead or pack meals very well She is eating fast food for breakfast and lunch.  They do generally cook dinner at home Weight is about 10 lbs up from the start of the year  Wt Readings from Last 3 Encounters:  09/23/17 286 lb (129.7 kg)  09/04/17 280 lb (127 kg)  11/19/16 272 lb (123.4 kg)   She is doing the same job, she is moving to a new home with her BF and is excited about this  Same partner- she is on OCP again, seems to be working ok  She uses the pills as rx and hs a bleed monthly  Her HA have overall been pretty good, may still occur maybe every couple  of weeks She does not get aura  She is overall doing ok, she is a bit frustrated but lack of weight gain  Her parents have been larger but they are slimming down Admits that she is not really exercising and is not eating that great either  She has cut down on fast food which is good Patient Active Problem List   Diagnosis Date Noted  . Common migraine with intractable migraine 04/20/2015    Past Medical History:  Diagnosis Date  . Common migraine with intractable migraine 04/20/2015    Past Surgical History:  Procedure Laterality Date  . WISDOM TOOTH EXTRACTION      Social History   Tobacco Use  . Smoking status: Never Smoker  . Smokeless tobacco: Never Used  Substance Use Topics  . Alcohol use: No  . Drug use: No    Family History  Problem Relation Age of Onset  . Diabetes Mother   . Migraines Mother   . Bell's palsy Father   . Bell's palsy Sister     Allergies  Allergen Reactions  . Tamiflu [Oseltamivir Phosphate] Shortness Of Breath    Medication list has  been reviewed and updated.  Current Outpatient Medications on File Prior to Visit  Medication Sig Dispense Refill  . levonorgestrel-ethinyl estradiol (AVIANE,ALESSE,LESSINA) 0.1-20 MG-MCG tablet Take 1 tablet by mouth daily. May take active pills continuously for 42 or 63 days 3 Package 0   No current facility-administered medications on file prior to visit.     Review of Systems:  As per HPI- otherwise negative. Menses normal No CP or SOB No vomiting or diarrhea    Physical Examination: Vitals:   09/23/17 1342  BP: 122/78  Pulse: 88  Resp: 16  SpO2: 98%   Vitals:   09/23/17 1342  Weight: 286 lb (129.7 kg)  Height: 5\' 8"  (1.727 m)   Body mass index is 43.49 kg/m. Ideal Body Weight: Weight in (lb) to have BMI = 25: 164.1  GEN: WDWN, NAD, Non-toxic, A & O x 3, obese, looks well  HEENT: Atraumatic, Normocephalic. Neck supple. No masses, No LAD.  Bilateral TM wnl, oropharynx normal.   PEERL,EOMI.   Ears and Nose: No external deformity. CV: RRR, No M/G/R. No JVD. No thrill. No extra heart sounds. PULM: CTA B, no wheezes, crackles, rhonchi. No retractions. No resp. distress. No accessory muscle use. ABD: S, NT, ND. No rebound. No HSM. EXTR: No c/c/e NEURO Normal gait.  PSYCH: Normally interactive. Conversant. Not depressed or anxious appearing.  Calm demeanor.    Assessment and Plan: Physical exam  Screening for diabetes mellitus - Plan: Comprehensive metabolic panel, Hemoglobin A1c  Screening for hyperlipidemia - Plan: Lipid panel  Screening for deficiency anemia - Plan: CBC  Counseling for birth control, oral contraceptives - Plan: levonorgestrel-ethinyl estradiol (AVIANE,ALESSE,LESSINA) 0.1-20 MG-MCG tablet  Weight gain - Plan: TSH  Immunization due - Plan: Tdap vaccine greater than or equal to 7yo IM  Class 3 severe obesity without serious comorbidity with body mass index (BMI) of 40.0 to 44.9 in adult, unspecified obesity type (HCC)  CPE today Pap next year Updated tdap Discussed her weight and strategies to control diet and fit in more exercise- she is going to work on this  Will plan further follow- up pending labs.   Signed Abbe AmsterdamJessica Resean Brander, MD  Received her labs 7/30-  Results for orders placed or performed in visit on 09/23/17  CBC  Result Value Ref Range   WBC 8.0 4.0 - 10.5 K/uL   RBC 4.63 3.87 - 5.11 Mil/uL   Platelets 314.0 150.0 - 400.0 K/uL   Hemoglobin 14.2 12.0 - 15.0 g/dL   HCT 14.742.0 82.936.0 - 56.246.0 %   MCV 90.6 78.0 - 100.0 fl   MCHC 33.9 30.0 - 36.0 g/dL   RDW 13.012.4 86.511.5 - 78.415.5 %  Comprehensive metabolic panel  Result Value Ref Range   Sodium 139 135 - 145 mEq/L   Potassium 3.7 3.5 - 5.1 mEq/L   Chloride 102 96 - 112 mEq/L   CO2 29 19 - 32 mEq/L   Glucose, Bld 94 70 - 99 mg/dL   BUN 10 6 - 23 mg/dL   Creatinine, Ser 6.960.87 0.40 - 1.20 mg/dL   Total Bilirubin 0.4 0.2 - 1.2 mg/dL   Alkaline Phosphatase 70 39 - 117 U/L   AST 14 0  - 37 U/L   ALT 9 0 - 35 U/L   Total Protein 7.6 6.0 - 8.3 g/dL   Albumin 4.3 3.5 - 5.2 g/dL   Calcium 9.6 8.4 - 29.510.5 mg/dL   GFR 284.13101.92 >24.40>60.00 mL/min  Hemoglobin A1c  Result Value Ref Range  Hgb A1c MFr Bld 5.2 4.6 - 6.5 %  Lipid panel  Result Value Ref Range   Cholesterol 209 (H) 0 - 200 mg/dL   Triglycerides 78.4 0.0 - 149.0 mg/dL   HDL 69.62 >95.28 mg/dL   VLDL 41.3 0.0 - 24.4 mg/dL   LDL Cholesterol 010 (H) 0 - 99 mg/dL   Total CHOL/HDL Ratio 4    NonHDL 161.70   TSH  Result Value Ref Range   TSH 1.25 0.35 - 4.50 uIU/mL

## 2017-09-23 ENCOUNTER — Ambulatory Visit (INDEPENDENT_AMBULATORY_CARE_PROVIDER_SITE_OTHER): Payer: BLUE CROSS/BLUE SHIELD | Admitting: Family Medicine

## 2017-09-23 ENCOUNTER — Encounter: Payer: Self-pay | Admitting: Family Medicine

## 2017-09-23 VITALS — BP 122/78 | HR 88 | Resp 16 | Ht 68.0 in | Wt 286.0 lb

## 2017-09-23 DIAGNOSIS — Z131 Encounter for screening for diabetes mellitus: Secondary | ICD-10-CM

## 2017-09-23 DIAGNOSIS — Z6841 Body Mass Index (BMI) 40.0 and over, adult: Secondary | ICD-10-CM

## 2017-09-23 DIAGNOSIS — Z13220 Encounter for screening for lipoid disorders: Secondary | ICD-10-CM

## 2017-09-23 DIAGNOSIS — Z130 Encounter for screening for diseases of the blood and blood-forming organs and certain disorders involving the immune mechanism: Secondary | ICD-10-CM

## 2017-09-23 DIAGNOSIS — R635 Abnormal weight gain: Secondary | ICD-10-CM | POA: Diagnosis not present

## 2017-09-23 DIAGNOSIS — Z3009 Encounter for other general counseling and advice on contraception: Secondary | ICD-10-CM

## 2017-09-23 DIAGNOSIS — Z0000 Encounter for general adult medical examination without abnormal findings: Secondary | ICD-10-CM | POA: Diagnosis not present

## 2017-09-23 DIAGNOSIS — Z23 Encounter for immunization: Secondary | ICD-10-CM | POA: Diagnosis not present

## 2017-09-23 MED ORDER — LEVONORGESTREL-ETHINYL ESTRAD 0.1-20 MG-MCG PO TABS: 1.0000 | ORAL_TABLET | Freq: Every day | ORAL | 4 refills | Status: DC

## 2017-09-23 NOTE — Patient Instructions (Signed)
Great to see you as always- enjoy your new home!   We will plan for a pap next year for you  Take care and I will be in touch with your labs asap I would recommend that you work on gradual weight loss through diet and exercise.  With your busy schedule it sounds like morning exercise may be best- this is a challenge but you can do it!   Take care! Health Maintenance, Female Adopting a healthy lifestyle and getting preventive care can go a long way to promote health and wellness. Talk with your health care provider about what schedule of regular examinations is right for you. This is a good chance for you to check in with your provider about disease prevention and staying healthy. In between checkups, there are plenty of things you can do on your own. Experts have done a lot of research about which lifestyle changes and preventive measures are most likely to keep you healthy. Ask your health care provider for more information. Weight and diet Eat a healthy diet  Be sure to include plenty of vegetables, fruits, low-fat dairy products, and lean protein.  Do not eat a lot of foods high in solid fats, added sugars, or salt.  Get regular exercise. This is one of the most important things you can do for your health. ? Most adults should exercise for at least 150 minutes each week. The exercise should increase your heart rate and make you sweat (moderate-intensity exercise). ? Most adults should also do strengthening exercises at least twice a week. This is in addition to the moderate-intensity exercise.  Maintain a healthy weight  Body mass index (BMI) is a measurement that can be used to identify possible weight problems. It estimates body fat based on height and weight. Your health care provider can help determine your BMI and help you achieve or maintain a healthy weight.  For females 20 years of age and older: ? A BMI below 18.5 is considered underweight. ? A BMI of 18.5 to 24.9 is  normal. ? A BMI of 25 to 29.9 is considered overweight. ? A BMI of 30 and above is considered obese.  Watch levels of cholesterol and blood lipids  You should start having your blood tested for lipids and cholesterol at 25 years of age, then have this test every 5 years.  You may need to have your cholesterol levels checked more often if: ? Your lipid or cholesterol levels are high. ? You are older than 25 years of age. ? You are at high risk for heart disease.  Cancer screening Lung Cancer  Lung cancer screening is recommended for adults 23-68 years old who are at high risk for lung cancer because of a history of smoking.  A yearly low-dose CT scan of the lungs is recommended for people who: ? Currently smoke. ? Have quit within the past 15 years. ? Have at least a 30-pack-year history of smoking. A pack year is smoking an average of one pack of cigarettes a day for 1 year.  Yearly screening should continue until it has been 15 years since you quit.  Yearly screening should stop if you develop a health problem that would prevent you from having lung cancer treatment.  Breast Cancer  Practice breast self-awareness. This means understanding how your breasts normally appear and feel.  It also means doing regular breast self-exams. Let your health care provider know about any changes, no matter how small.  If you are  in your 20s or 30s, you should have a clinical breast exam (CBE) by a health care provider every 1-3 years as part of a regular health exam.  If you are 40 or older, have a CBE every year. Also consider having a breast X-ray (mammogram) every year.  If you have a family history of breast cancer, talk to your health care provider about genetic screening.  If you are at high risk for breast cancer, talk to your health care provider about having an MRI and a mammogram every year.  Breast cancer gene (BRCA) assessment is recommended for women who have family members  with BRCA-related cancers. BRCA-related cancers include: ? Breast. ? Ovarian. ? Tubal. ? Peritoneal cancers.  Results of the assessment will determine the need for genetic counseling and BRCA1 and BRCA2 testing.  Cervical Cancer Your health care provider may recommend that you be screened regularly for cancer of the pelvic organs (ovaries, uterus, and vagina). This screening involves a pelvic examination, including checking for microscopic changes to the surface of your cervix (Pap test). You may be encouraged to have this screening done every 3 years, beginning at age 21.  For women ages 30-65, health care providers may recommend pelvic exams and Pap testing every 3 years, or they may recommend the Pap and pelvic exam, combined with testing for human papilloma virus (HPV), every 5 years. Some types of HPV increase your risk of cervical cancer. Testing for HPV may also be done on women of any age with unclear Pap test results.  Other health care providers may not recommend any screening for nonpregnant women who are considered low risk for pelvic cancer and who do not have symptoms. Ask your health care provider if a screening pelvic exam is right for you.  If you have had past treatment for cervical cancer or a condition that could lead to cancer, you need Pap tests and screening for cancer for at least 20 years after your treatment. If Pap tests have been discontinued, your risk factors (such as having a new sexual partner) need to be reassessed to determine if screening should resume. Some women have medical problems that increase the chance of getting cervical cancer. In these cases, your health care provider may recommend more frequent screening and Pap tests.  Colorectal Cancer  This type of cancer can be detected and often prevented.  Routine colorectal cancer screening usually begins at 25 years of age and continues through 25 years of age.  Your health care provider may recommend  screening at an earlier age if you have risk factors for colon cancer.  Your health care provider may also recommend using home test kits to check for hidden blood in the stool.  A small camera at the end of a tube can be used to examine your colon directly (sigmoidoscopy or colonoscopy). This is done to check for the earliest forms of colorectal cancer.  Routine screening usually begins at age 50.  Direct examination of the colon should be repeated every 5-10 years through 25 years of age. However, you may need to be screened more often if early forms of precancerous polyps or small growths are found.  Skin Cancer  Check your skin from head to toe regularly.  Tell your health care provider about any new moles or changes in moles, especially if there is a change in a mole's shape or color.  Also tell your health care provider if you have a mole that is larger than the   size of a pencil eraser.  Always use sunscreen. Apply sunscreen liberally and repeatedly throughout the day.  Protect yourself by wearing long sleeves, pants, a wide-brimmed hat, and sunglasses whenever you are outside.  Heart disease, diabetes, and high blood pressure  High blood pressure causes heart disease and increases the risk of stroke. High blood pressure is more likely to develop in: ? People who have blood pressure in the high end of the normal range (130-139/85-89 mm Hg). ? People who are overweight or obese. ? People who are African American.  If you are 18-39 years of age, have your blood pressure checked every 3-5 years. If you are 40 years of age or older, have your blood pressure checked every year. You should have your blood pressure measured twice-once when you are at a hospital or clinic, and once when you are not at a hospital or clinic. Record the average of the two measurements. To check your blood pressure when you are not at a hospital or clinic, you can use: ? An automated blood pressure machine at  a pharmacy. ? A home blood pressure monitor.  If you are between 55 years and 79 years old, ask your health care provider if you should take aspirin to prevent strokes.  Have regular diabetes screenings. This involves taking a blood sample to check your fasting blood sugar level. ? If you are at a normal weight and have a low risk for diabetes, have this test once every three years after 25 years of age. ? If you are overweight and have a high risk for diabetes, consider being tested at a younger age or more often. Preventing infection Hepatitis B  If you have a higher risk for hepatitis B, you should be screened for this virus. You are considered at high risk for hepatitis B if: ? You were born in a country where hepatitis B is common. Ask your health care provider which countries are considered high risk. ? Your parents were born in a high-risk country, and you have not been immunized against hepatitis B (hepatitis B vaccine). ? You have HIV or AIDS. ? You use needles to inject street drugs. ? You live with someone who has hepatitis B. ? You have had sex with someone who has hepatitis B. ? You get hemodialysis treatment. ? You take certain medicines for conditions, including cancer, organ transplantation, and autoimmune conditions.  Hepatitis C  Blood testing is recommended for: ? Everyone born from 1945 through 1965. ? Anyone with known risk factors for hepatitis C.  Sexually transmitted infections (STIs)  You should be screened for sexually transmitted infections (STIs) including gonorrhea and chlamydia if: ? You are sexually active and are younger than 24 years of age. ? You are older than 24 years of age and your health care provider tells you that you are at risk for this type of infection. ? Your sexual activity has changed since you were last screened and you are at an increased risk for chlamydia or gonorrhea. Ask your health care provider if you are at risk.  If you do  not have HIV, but are at risk, it may be recommended that you take a prescription medicine daily to prevent HIV infection. This is called pre-exposure prophylaxis (PrEP). You are considered at risk if: ? You are sexually active and do not regularly use condoms or know the HIV status of your partner(s). ? You take drugs by injection. ? You are sexually active with a partner   who has HIV.  Talk with your health care provider about whether you are at high risk of being infected with HIV. If you choose to begin PrEP, you should first be tested for HIV. You should then be tested every 3 months for as long as you are taking PrEP. Pregnancy  If you are premenopausal and you may become pregnant, ask your health care provider about preconception counseling.  If you may become pregnant, take 400 to 800 micrograms (mcg) of folic acid every day.  If you want to prevent pregnancy, talk to your health care provider about birth control (contraception). Osteoporosis and menopause  Osteoporosis is a disease in which the bones lose minerals and strength with aging. This can result in serious bone fractures. Your risk for osteoporosis can be identified using a bone density scan.  If you are 59 years of age or older, or if you are at risk for osteoporosis and fractures, ask your health care provider if you should be screened.  Ask your health care provider whether you should take a calcium or vitamin D supplement to lower your risk for osteoporosis.  Menopause may have certain physical symptoms and risks.  Hormone replacement therapy may reduce some of these symptoms and risks. Talk to your health care provider about whether hormone replacement therapy is right for you. Follow these instructions at home:  Schedule regular health, dental, and eye exams.  Stay current with your immunizations.  Do not use any tobacco products including cigarettes, chewing tobacco, or electronic cigarettes.  If you are  pregnant, do not drink alcohol.  If you are breastfeeding, limit how much and how often you drink alcohol.  Limit alcohol intake to no more than 1 drink per day for nonpregnant women. One drink equals 12 ounces of beer, 5 ounces of wine, or 1 ounces of hard liquor.  Do not use street drugs.  Do not share needles.  Ask your health care provider for help if you need support or information about quitting drugs.  Tell your health care provider if you often feel depressed.  Tell your health care provider if you have ever been abused or do not feel safe at home. This information is not intended to replace advice given to you by your health care provider. Make sure you discuss any questions you have with your health care provider. Document Released: 08/28/2010 Document Revised: 07/21/2015 Document Reviewed: 11/16/2014 Elsevier Interactive Patient Education  [employer].

## 2017-09-24 ENCOUNTER — Encounter: Payer: Self-pay | Admitting: Family Medicine

## 2017-09-24 LAB — CBC
HEMATOCRIT: 42 % (ref 36.0–46.0)
Hemoglobin: 14.2 g/dL (ref 12.0–15.0)
MCHC: 33.9 g/dL (ref 30.0–36.0)
MCV: 90.6 fl (ref 78.0–100.0)
PLATELETS: 314 10*3/uL (ref 150.0–400.0)
RBC: 4.63 Mil/uL (ref 3.87–5.11)
RDW: 12.4 % (ref 11.5–15.5)
WBC: 8 10*3/uL (ref 4.0–10.5)

## 2017-09-24 LAB — TSH: TSH: 1.25 u[IU]/mL (ref 0.35–4.50)

## 2017-09-24 LAB — COMPREHENSIVE METABOLIC PANEL
ALBUMIN: 4.3 g/dL (ref 3.5–5.2)
ALK PHOS: 70 U/L (ref 39–117)
ALT: 9 U/L (ref 0–35)
AST: 14 U/L (ref 0–37)
BUN: 10 mg/dL (ref 6–23)
CALCIUM: 9.6 mg/dL (ref 8.4–10.5)
CO2: 29 mEq/L (ref 19–32)
CREATININE: 0.87 mg/dL (ref 0.40–1.20)
Chloride: 102 mEq/L (ref 96–112)
GFR: 101.92 mL/min (ref >60.00)
Glucose, Bld: 94 mg/dL (ref 70–99)
Potassium: 3.7 mEq/L (ref 3.5–5.1)
SODIUM: 139 meq/L (ref 135–145)
TOTAL PROTEIN: 7.6 g/dL (ref 6.0–8.3)
Total Bilirubin: 0.4 mg/dL (ref 0.2–1.2)

## 2017-09-24 LAB — LIPID PANEL
CHOLESTEROL: 209 mg/dL — AB (ref 0–200)
HDL: 47.6 mg/dL (ref >39.00)
LDL Cholesterol: 145 mg/dL — ABNORMAL HIGH (ref 0–99)
NONHDL: 161.7
Total CHOL/HDL Ratio: 4
Triglycerides: 86 mg/dL (ref 0.0–149.0)
VLDL: 17.2 mg/dL (ref 0.0–40.0)

## 2017-09-24 LAB — HEMOGLOBIN A1C: HEMOGLOBIN A1C: 5.2 % (ref 4.6–6.5)

## 2017-11-30 ENCOUNTER — Other Ambulatory Visit: Payer: Self-pay | Admitting: Family Medicine

## 2017-11-30 DIAGNOSIS — Z3009 Encounter for other general counseling and advice on contraception: Secondary | ICD-10-CM

## 2018-02-24 DIAGNOSIS — H43393 Other vitreous opacities, bilateral: Secondary | ICD-10-CM | POA: Diagnosis not present

## 2018-02-24 DIAGNOSIS — H04123 Dry eye syndrome of bilateral lacrimal glands: Secondary | ICD-10-CM | POA: Diagnosis not present

## 2018-02-24 DIAGNOSIS — H1013 Acute atopic conjunctivitis, bilateral: Secondary | ICD-10-CM | POA: Diagnosis not present

## 2018-02-24 DIAGNOSIS — H4710 Unspecified papilledema: Secondary | ICD-10-CM | POA: Diagnosis not present

## 2018-03-25 DIAGNOSIS — H4710 Unspecified papilledema: Secondary | ICD-10-CM | POA: Diagnosis not present

## 2018-03-25 DIAGNOSIS — G514 Facial myokymia: Secondary | ICD-10-CM | POA: Diagnosis not present

## 2018-03-25 DIAGNOSIS — H04123 Dry eye syndrome of bilateral lacrimal glands: Secondary | ICD-10-CM | POA: Diagnosis not present

## 2018-03-25 DIAGNOSIS — H5702 Anisocoria: Secondary | ICD-10-CM | POA: Diagnosis not present

## 2018-04-22 DIAGNOSIS — F411 Generalized anxiety disorder: Secondary | ICD-10-CM | POA: Diagnosis not present

## 2018-04-22 DIAGNOSIS — F330 Major depressive disorder, recurrent, mild: Secondary | ICD-10-CM | POA: Diagnosis not present

## 2018-04-29 DIAGNOSIS — F411 Generalized anxiety disorder: Secondary | ICD-10-CM | POA: Diagnosis not present

## 2018-04-29 DIAGNOSIS — F330 Major depressive disorder, recurrent, mild: Secondary | ICD-10-CM | POA: Diagnosis not present

## 2018-05-07 DIAGNOSIS — F411 Generalized anxiety disorder: Secondary | ICD-10-CM | POA: Diagnosis not present

## 2018-05-07 DIAGNOSIS — F330 Major depressive disorder, recurrent, mild: Secondary | ICD-10-CM | POA: Diagnosis not present

## 2018-05-12 ENCOUNTER — Ambulatory Visit: Payer: BLUE CROSS/BLUE SHIELD | Admitting: Neurology

## 2018-05-14 ENCOUNTER — Ambulatory Visit (INDEPENDENT_AMBULATORY_CARE_PROVIDER_SITE_OTHER): Payer: BLUE CROSS/BLUE SHIELD | Admitting: Neurology

## 2018-05-14 ENCOUNTER — Other Ambulatory Visit: Payer: Self-pay

## 2018-05-14 ENCOUNTER — Encounter: Payer: Self-pay | Admitting: Neurology

## 2018-05-14 VITALS — BP 120/84 | HR 71 | Ht 69.0 in | Wt 292.0 lb

## 2018-05-14 DIAGNOSIS — H4710 Unspecified papilledema: Secondary | ICD-10-CM | POA: Diagnosis not present

## 2018-05-14 DIAGNOSIS — F330 Major depressive disorder, recurrent, mild: Secondary | ICD-10-CM | POA: Diagnosis not present

## 2018-05-14 DIAGNOSIS — Z5181 Encounter for therapeutic drug level monitoring: Secondary | ICD-10-CM | POA: Diagnosis not present

## 2018-05-14 DIAGNOSIS — G43019 Migraine without aura, intractable, without status migrainosus: Secondary | ICD-10-CM | POA: Diagnosis not present

## 2018-05-14 DIAGNOSIS — F411 Generalized anxiety disorder: Secondary | ICD-10-CM | POA: Diagnosis not present

## 2018-05-14 NOTE — Progress Notes (Signed)
Reason for visit: Papilledema  Referring physician: Dr. Lois Huxley is a 28 y.o. female  History of present illness:  Joan Lee is a 28 year old right-handed black female with a history of obesity and a history of migraine headache.  The patient is on birth control pills currently, she has been on birth control since 2012.  The patient indicates that her mother also has migraine headache, the patient will average about 2 headaches a month, there have been no real change in headache frequency recently.  Since December 2019, the patient has noted some occasional pulsatile tinnitus in the left ear that goes away when she sits up.  The patient denies any visual changes such as blurred vision or double vision.  She has had some twitching around the eyes on occasion.  She went to Dr. Alben Spittle for this, she was found to have papilledema that was relatively mild, she is sent to this office for an evaluation.  She denies any significant neck stiffness.  She denies any numbness or weakness of the extremities with exception that she has a sensation of left arm weakness.  She denies any problems with the legs.  She has not had problems controlling the bowels or the bladder or troubles with balance.  She is sent to this office for an evaluation.  Past Medical History:  Diagnosis Date  . Common migraine with intractable migraine 04/20/2015    Past Surgical History:  Procedure Laterality Date  . WISDOM TOOTH EXTRACTION      Family History  Problem Relation Age of Onset  . Diabetes Mother   . Migraines Mother   . High blood pressure Mother   . Bell's palsy Father   . Bell's palsy Sister     Social history:  reports that she has never smoked. She has never used smokeless tobacco. She reports that she does not drink alcohol or use drugs.  Medications:  Prior to Admission medications   Medication Sig Start Date End Date Taking? Authorizing Provider  levonorgestrel-ethinyl estradiol  (AVIANE,ALESSE,LESSINA) 0.1-20 MG-MCG tablet Take 1 tablet by mouth daily. May take active pills continuously for 42 or 63 days 09/23/17  Yes Copland, Gwenlyn Found, MD  levonorgestrel-ethinyl estradiol (AVIANE,ALESSE,LESSINA) 0.1-20 MG-MCG tablet Take 1 tablet by mouth daily. 12/02/17  Yes Copland, Gwenlyn Found, MD      Allergies  Allergen Reactions  . Tamiflu [Oseltamivir Phosphate] Shortness Of Breath    ROS:  Out of a complete 14 system review of symptoms, the patient complains only of the following symptoms, and all other reviewed systems are negative.  Depression, anxiety  Blood pressure 120/84, pulse 71, height 5\' 9"  (1.753 m), weight 292 lb (132.5 kg).  Physical Exam  General: The patient is alert and cooperative at the time of the examination.  The patient is markedly obese.  Eyes: Pupils are equal, round, and reactive to light. Disc margins are slightly blurred bilaterally, mainly medially.  Neck: The neck is supple, no carotid bruits are noted.  Respiratory: The respiratory examination is clear.  Cardiovascular: The cardiovascular examination reveals a regular rate and rhythm, no obvious murmurs or rubs are noted.  Skin: Extremities are without significant edema.  Neurologic Exam  Mental status: The patient is alert and oriented x 3 at the time of the examination. The patient has apparent normal recent and remote memory, with an apparently normal attention span and concentration ability.  Cranial nerves: Facial symmetry is present. There is good sensation of the face to  pinprick and soft touch bilaterally. The strength of the facial muscles and the muscles to head turning and shoulder shrug are normal bilaterally. Speech is well enunciated, no aphasia or dysarthria is noted. Extraocular movements are full. Visual fields are full. The tongue is midline, and the patient has symmetric elevation of the soft palate. No obvious hearing deficits are noted.  Motor: The motor testing  reveals 5 over 5 strength of all 4 extremities. Good symmetric motor tone is noted throughout.  Sensory: Sensory testing is intact to pinprick, soft touch, vibration sensation, and position sense on all 4 extremities. No evidence of extinction is noted.  Coordination: Cerebellar testing reveals good finger-nose-finger and heel-to-shin bilaterally.  Gait and station: Gait is normal. Tandem gait is normal. Romberg is negative. No drift is seen.  Reflexes: Deep tendon reflexes are symmetric and normal bilaterally. Toes are downgoing bilaterally.   Assessment/Plan:  1.  Mild papilledema  2.  Migraine headache  3.  Obesity  The patient may have a pre-clinical pseudotumor cerebri syndrome.  The patient has migraine headache, she has noted some pulsatile tinnitus in the left ear over the last several months.  She does report a 20 pound weight gain over the last year, she is weighing almost 300 pounds at this time.  The patient is on birth control pills which may also promote pseudotumor cerebri but she has been on this since 2012.  The patient will have blood work today, she will have MRI of the brain.  Lumbar puncture will be done after the MRI if the study is unremarkable.  She will follow-up here in 4 months.  Marlan Palau MD 05/14/2018 3:28 PM  [facility] [address] [place], [place] [address]  Phone [phone] Fax [phone]

## 2018-05-15 ENCOUNTER — Telehealth: Payer: Self-pay | Admitting: Neurology

## 2018-05-15 LAB — BASIC METABOLIC PANEL
BUN/Creatinine Ratio: 9 (ref 9–23)
BUN: 7 mg/dL (ref 6–20)
CALCIUM: 9.6 mg/dL (ref 8.7–10.2)
CO2: 23 mmol/L (ref 20–29)
CREATININE: 0.8 mg/dL (ref 0.57–1.00)
Chloride: 103 mmol/L (ref 96–106)
GFR, EST AFRICAN AMERICAN: 119 mL/min/{1.73_m2} (ref >59)
GFR, EST NON AFRICAN AMERICAN: 103 mL/min/{1.73_m2} (ref >59)
Glucose: 67 mg/dL (ref 65–99)
Potassium: 4.7 mmol/L (ref 3.5–5.2)
Sodium: 141 mmol/L (ref 134–144)

## 2018-05-15 NOTE — Telephone Encounter (Signed)
i did ask the screening questions  MR Brain w/wo contrast Dr. Lockie Mola Auth: [id number] (exp. 05/15/18 to 06/13/18). Patient is scheduled for 06/03/18 at [facility].

## 2018-05-21 DIAGNOSIS — F411 Generalized anxiety disorder: Secondary | ICD-10-CM | POA: Diagnosis not present

## 2018-05-21 DIAGNOSIS — F330 Major depressive disorder, recurrent, mild: Secondary | ICD-10-CM | POA: Diagnosis not present

## 2018-05-24 DIAGNOSIS — S63611A Unspecified sprain of left index finger, initial encounter: Secondary | ICD-10-CM | POA: Diagnosis not present

## 2018-05-28 DIAGNOSIS — F330 Major depressive disorder, recurrent, mild: Secondary | ICD-10-CM | POA: Diagnosis not present

## 2018-05-28 DIAGNOSIS — F411 Generalized anxiety disorder: Secondary | ICD-10-CM | POA: Diagnosis not present

## 2018-05-28 NOTE — Telephone Encounter (Signed)
Noted, thank you

## 2018-05-28 NOTE — Telephone Encounter (Signed)
I called and spoke with the patient, made her aware we were not doing MRI;s in office due to COVID-19 outbreak, explained we were sending her MRI to [facility] and I gave her their phone number. DW

## 2018-05-29 ENCOUNTER — Ambulatory Visit: Payer: BLUE CROSS/BLUE SHIELD | Admitting: Obstetrics & Gynecology

## 2018-05-30 ENCOUNTER — Encounter: Payer: Self-pay | Admitting: Family Medicine

## 2018-05-30 ENCOUNTER — Other Ambulatory Visit: Payer: Self-pay

## 2018-05-30 ENCOUNTER — Ambulatory Visit (INDEPENDENT_AMBULATORY_CARE_PROVIDER_SITE_OTHER): Payer: BLUE CROSS/BLUE SHIELD | Admitting: Family Medicine

## 2018-05-30 VITALS — BP 124/65 | HR 71 | Temp 98.6°F | Ht 68.0 in | Wt 295.0 lb

## 2018-05-30 DIAGNOSIS — N898 Other specified noninflammatory disorders of vagina: Secondary | ICD-10-CM | POA: Diagnosis not present

## 2018-05-30 DIAGNOSIS — N760 Acute vaginitis: Secondary | ICD-10-CM | POA: Diagnosis not present

## 2018-05-30 DIAGNOSIS — K130 Diseases of lips: Secondary | ICD-10-CM

## 2018-05-30 DIAGNOSIS — B9689 Other specified bacterial agents as the cause of diseases classified elsewhere: Secondary | ICD-10-CM

## 2018-05-30 MED ORDER — CLOTRIMAZOLE-BETAMETHASONE 1-0.05 % EX CREA: 1.0000 "application " | TOPICAL_CREAM | Freq: Two times a day (BID) | CUTANEOUS | 2 refills | Status: DC

## 2018-05-30 NOTE — Progress Notes (Signed)
   Subjective:    Patient ID: Joan Lee, female    DOB: 19-Aug-1990, 28 y.o.   MRN: [id number]  HPI Patient seen for itching on her inguinal intertriginous area, right greater than left that started 2 months ago. Has become worse. Hasn't noticed a rash. Some increased vaginal discharge that is foul smelling at times.     Review of Systems     Objective:   Physical Exam Constitutional:      Appearance: Normal appearance.  Genitourinary:   Neurological:     Mental Status: She is alert.       Assessment & Plan:  1. Intertrigo labialis Lotrisome prescribed. Use BID. Patient to message if becomes worse. Recommended different underwear that won't rub in that area until healed.  2. Vaginal irritation Blind vaginal culture obtained. - Cervicovaginal ancillary only( [place])

## 2018-05-30 NOTE — Progress Notes (Signed)
Pt states that she has been having some itching outside of her vagina and on her inner thighs x 2 months.

## 2018-06-02 LAB — CERVICOVAGINAL ANCILLARY ONLY
Bacterial vaginitis: POSITIVE — AB
Candida vaginitis: NEGATIVE

## 2018-06-03 ENCOUNTER — Other Ambulatory Visit: Payer: BLUE CROSS/BLUE SHIELD

## 2018-06-03 ENCOUNTER — Other Ambulatory Visit: Payer: Self-pay | Admitting: Family Medicine

## 2018-06-03 MED ORDER — METRONIDAZOLE 500 MG PO TABS: 500.0000 mg | ORAL_TABLET | Freq: Two times a day (BID) | ORAL | 0 refills | Status: DC

## 2018-06-04 DIAGNOSIS — F330 Major depressive disorder, recurrent, mild: Secondary | ICD-10-CM | POA: Diagnosis not present

## 2018-06-04 DIAGNOSIS — F411 Generalized anxiety disorder: Secondary | ICD-10-CM | POA: Diagnosis not present

## 2018-06-11 DIAGNOSIS — F330 Major depressive disorder, recurrent, mild: Secondary | ICD-10-CM | POA: Diagnosis not present

## 2018-06-18 DIAGNOSIS — F330 Major depressive disorder, recurrent, mild: Secondary | ICD-10-CM | POA: Diagnosis not present

## 2018-06-23 ENCOUNTER — Other Ambulatory Visit: Payer: Self-pay

## 2018-06-23 ENCOUNTER — Ambulatory Visit (INDEPENDENT_AMBULATORY_CARE_PROVIDER_SITE_OTHER): Payer: BLUE CROSS/BLUE SHIELD

## 2018-06-23 VITALS — BP 125/76 | HR 67 | Ht 68.0 in | Wt 294.1 lb

## 2018-06-23 DIAGNOSIS — R399 Unspecified symptoms and signs involving the genitourinary system: Secondary | ICD-10-CM

## 2018-06-23 LAB — POCT URINALYSIS DIPSTICK
Bilirubin, UA: NEGATIVE
Glucose, UA: NEGATIVE
Ketones, UA: NEGATIVE
Nitrite, UA: NEGATIVE
Protein, UA: POSITIVE — AB
Spec Grav, UA: 1.015 (ref 1.010–1.025)
pH, UA: 6.5 (ref 5.0–8.0)

## 2018-06-23 NOTE — Progress Notes (Addendum)
Pt presents to the office c/o  of urinary frequency, urgency,burning x 5 days. UA showed moderate leukocytes.Urine culture was sent to the lab.  Preslynn Bier l Carnelia Oscar, CMA  Attestation of Attending Supervision of RN: Evaluation and management procedures were performed by the nurse under my supervision and collaboration.  I have reviewed the nursing note and chart, and I agree with the management and plan.  Carolyn L. Harraway-Smith, M.D., [place]n Core

## 2018-06-25 DIAGNOSIS — F330 Major depressive disorder, recurrent, mild: Secondary | ICD-10-CM | POA: Diagnosis not present

## 2018-06-25 LAB — URINE CULTURE

## 2018-06-26 ENCOUNTER — Ambulatory Visit (INDEPENDENT_AMBULATORY_CARE_PROVIDER_SITE_OTHER): Payer: BLUE CROSS/BLUE SHIELD | Admitting: Obstetrics & Gynecology

## 2018-06-26 ENCOUNTER — Encounter: Payer: Self-pay | Admitting: Obstetrics & Gynecology

## 2018-06-26 VITALS — Ht 68.0 in | Wt 294.0 lb

## 2018-06-26 DIAGNOSIS — N926 Irregular menstruation, unspecified: Secondary | ICD-10-CM | POA: Diagnosis not present

## 2018-06-26 MED ORDER — MEGESTROL ACETATE 40 MG PO TABS: 40.0000 mg | ORAL_TABLET | Freq: Every day | ORAL | 0 refills | Status: DC

## 2018-06-26 NOTE — Progress Notes (Signed)
[facility]  I connected with pt on 06/26/18 at  9:15 AM EDT by WebEx at home and verified that I am speaking with the correct person using two identifiers.   I discussed the limitations, risks, security and privacy concerns of performing an evaluation and management service by telephone and the availability of in person appointments. I also discussed with the patient that there may be a patient responsible charge related to this service. The patient expressed understanding and agreed to proceed.   History:  Joan Lee is a 28 y.o. G0P0000 female being evaluated today for prolonged bleeding. Pt reports that she started her period a little late this month and has been bleeding since that time. Pt is on OCPs she has been on low OCPs. She has been on OCPs since July. She denies irreg menses since that time. Pt is on OCPs for cramps and periods. Pt is sexually active with female. No h/o STIs.    She denies any abnormal vaginal discharge, pelvic pain or other concerns.  Pts menses are to begin on Tues.       Past Medical History:  Diagnosis Date  . Common migraine with intractable migraine 04/20/2015   Past Surgical History:  Procedure Laterality Date  . WISDOM TOOTH EXTRACTION     The following portions of the patient's history were reviewed and updated as appropriate: allergies, current medications, past family history, past medical history, past social history, past surgical history and problem list.    Review of Systems:  Pertinent items noted in HPI and remainder of comprehensive ROS otherwise negative.  Physical Exam:   General:  Alert, oriented and cooperative. Patient appears to be in no acute distress.  Mental Status: Normal mood and affect. Normal behavior. Normal judgment and thought content.   Respiratory: Normal respiratory effort, no problems with respiration noted  Rest of physical exam deferred due to type of encounter  Labs and  Imaging Results for orders placed or performed in visit on 06/23/18 (from the past 336 hour(s))  POCT urinalysis dipstick   Collection Time: 06/23/18  9:29 AM  Result Value Ref Range   Color, UA     Clarity, UA     Glucose, UA Negative Negative   Bilirubin, UA negative    Ketones, UA negative    Spec Grav, UA 1.015 1.010 - 1.025   Blood, UA large    pH, UA 6.5 5.0 - 8.0   Protein, UA Positive (A) Negative   Urobilinogen, UA     Nitrite, UA negative    Leukocytes, UA Moderate (2+) (A) Negative   Appearance     Odor    Urine Culture   Collection Time: 06/23/18  9:56 AM  Result Value Ref Range   Urine Culture, Routine Final report    Organism ID, Bacteria Comment    No results found.     Assessment and Plan:     Irreg bleeding  Megace 40mg  on tues with onset of menses.   Pt will call to update her bleeding pattern in 2 weeks after the Megace has started.        I discussed the assessment and treatment plan with the patient. The patient was provided an opportunity to ask questions and all were answered. The patient agreed with the plan and demonstrated an understanding of the instructions.   The patient was advised to call back or seek an in-person evaluation/go to the ED if the symptoms worsen or  if the condition fails to improve as anticipated.  I provided 11 minutes of face-to-face via WebEx time during this encounter.   Willodean Rosenthal, MD [facility] [employer], [facility]

## 2018-06-26 NOTE — Progress Notes (Signed)
Period started on April 9th and is still having it currently Patient is having blood clots - biggest is quarter size patient is having pain with period. Armandina Stammer RN

## 2018-07-02 ENCOUNTER — Encounter: Payer: Self-pay | Admitting: Family Medicine

## 2018-07-02 ENCOUNTER — Other Ambulatory Visit: Payer: Self-pay

## 2018-07-02 ENCOUNTER — Ambulatory Visit (INDEPENDENT_AMBULATORY_CARE_PROVIDER_SITE_OTHER): Payer: BLUE CROSS/BLUE SHIELD | Admitting: Family Medicine

## 2018-07-02 DIAGNOSIS — F339 Major depressive disorder, recurrent, unspecified: Secondary | ICD-10-CM

## 2018-07-02 DIAGNOSIS — F330 Major depressive disorder, recurrent, mild: Secondary | ICD-10-CM | POA: Diagnosis not present

## 2018-07-02 MED ORDER — FLUOXETINE HCL 20 MG PO TABS: 20.0000 mg | ORAL_TABLET | Freq: Every day | ORAL | 5 refills | Status: DC

## 2018-07-02 NOTE — Patient Instructions (Addendum)
It was great to talk to you today- but I am sorry that you are suffering from depression!  I do think we can get you feeling better As we discussed, start on Prozac 20 mg daily.  Increase to 40 mg after 2 to 3 weeks Let me know if any problems or concerns Please update me regarding her condition in about 1 month.  Work on exercise, spend time outdoors, try to do things that make you feel good!    Take care

## 2018-07-02 NOTE — Progress Notes (Signed)
[facility] [address], [address] [place], [place] [address] [phone]  Date:  07/02/2018   Name:  Joan Lee   DOB:  [birth year]   MRN:  [id number]  PCP:  Pearline Cables, MD    Chief Complaint: No chief complaint on file.   History of Present Illness:  Joan Lee is a 28 y.o. very pleasant female patient who presents with the following:  Virtual visit today due to pandemic Pt location is home Provider location is home I last saw her in July for physical Patient identity is confirmed with 2 identifiers, she is okay with doing a virtual visit today She has history of obesity, migraine headache  She also sees GYN, Dr. Erin Fulling for irregular menstrual bleeding- they have her on megace, she just started yesterday as her menses began  Pt has been seeing a therapist for a couple of months now Her therapist recommended that she consult with me to start on medication for depression She has suffered from depression for some time,she saw a therapist back when she was in school, and is now seeing a private therapist since she graduated  Her sx of depression are lack of energy, social isolation, low libido She also suffers from anxiety She tends to bite her nails when she is anxious She has not been treated for depression in the past She feels like depression and anxiety are both about equally problematic for her   Her boyfriend is supportive, sometimes attends therapy with her  She is not aware of any failure numbers on treatment for depression  Asked about suicidal ideation.  She admits to some fleeting thoughts, but states that she would never carry them out.  She does not feel she is any danger of self-harm   Patient Active Problem List   Diagnosis Date Noted  . Common migraine with intractable migraine 04/20/2015    Past Medical History:  Diagnosis Date  . Common migraine with intractable migraine  04/20/2015    Past Surgical History:  Procedure Laterality Date  . WISDOM TOOTH EXTRACTION      Social History   Tobacco Use  . Smoking status: Never Smoker  . Smokeless tobacco: Never Used  Substance Use Topics  . Alcohol use: No  . Drug use: No    Family History  Problem Relation Age of Onset  . Diabetes Mother   . Migraines Mother   . High blood pressure Mother   . Bell's palsy Father   . Bell's palsy Sister     Allergies  Allergen Reactions  . Tamiflu [Oseltamivir Phosphate] Shortness Of Breath    Medication list has been reviewed and updated.  Current Outpatient Medications on File Prior to Visit  Medication Sig Dispense Refill  . clotrimazole-betamethasone (LOTRISONE) cream Apply 1 application topically 2 (two) times daily. (Patient not taking: Reported on 06/23/2018) 45 g 2  . levonorgestrel-ethinyl estradiol (AVIANE,ALESSE,LESSINA) 0.1-20 MG-MCG tablet Take 1 tablet by mouth daily. May take active pills continuously for 42 or 63 days 3 Package 4  . levonorgestrel-ethinyl estradiol (AVIANE,ALESSE,LESSINA) 0.1-20 MG-MCG tablet Take 1 tablet by mouth daily. 84 tablet 5  . megestrol (MEGACE) 40 MG tablet Take 1 tablet (40 mg total) by mouth daily. Can increase to two tablets twice a day in the event of heavy bleeding 40 tablet 0  . metroNIDAZOLE (FLAGYL) 500 MG tablet Take 1 tablet (500 mg total) by mouth 2 (two) times daily. 14 tablet  0   No current facility-administered medications on file prior to visit.     Review of Systems:  As per HPI- otherwise negative. She is feeling depressed or anxious   Physical Examination: There were no vitals filed for this visit. There were no vitals filed for this visit. There is no height or weight on file to calculate BMI. Ideal Body Weight:    Pt observed over video She looks well, no cough, wheezing or tachypnea is observed  Assessment and Plan: Depression, recurrent (HCC) - Plan: FLUoxetine (PROZAC) 20 MG  tablet  We talked about depression today.  Patient suffered from depression for several years, but so far has not sought treatment.  She would now like to begin treatment, will start her on an SSRI She is not pregnant Rx for Prozac 20 mg.  She will take 1 a day for 2 or 3 weeks, then can increase to 2 pills or 40 mg daily Went over most frequent side effects and what to expect Asked her to recheck with me either with the visit or via MyChart in 1 month-sooner if not doing okay Encouraged exercise, time outdoors, activities which she enjoys  Meds ordered this encounter  Medications  . FLUoxetine (PROZAC) 20 MG tablet    Sig: Take 1 tablet (20 mg total) by mouth daily. Increase to 40 mg after 2-3 weeks    Dispense:  60 tablet    Refill:  5   I sent her the following my chart message  It was great to talk to you today- but I am sorry that you are suffering from depression!  I do think we can get you feeling better As we discussed, start on Prozac 20 mg daily.  Increase to 40 mg after 2 to 3 weeks Let me know if any problems or concerns Please update me regarding her condition in about 1 month.  Work on exercise, spend time outdoors, try to do things that make you feel good!    Take care  Signed Abbe AmsterdamJessica Copland, MD

## 2018-07-09 DIAGNOSIS — F411 Generalized anxiety disorder: Secondary | ICD-10-CM | POA: Diagnosis not present

## 2018-07-09 DIAGNOSIS — F330 Major depressive disorder, recurrent, mild: Secondary | ICD-10-CM | POA: Diagnosis not present

## 2018-07-16 DIAGNOSIS — F330 Major depressive disorder, recurrent, mild: Secondary | ICD-10-CM | POA: Diagnosis not present

## 2018-07-16 DIAGNOSIS — F411 Generalized anxiety disorder: Secondary | ICD-10-CM | POA: Diagnosis not present

## 2018-07-23 DIAGNOSIS — F330 Major depressive disorder, recurrent, mild: Secondary | ICD-10-CM | POA: Diagnosis not present

## 2018-07-23 DIAGNOSIS — F411 Generalized anxiety disorder: Secondary | ICD-10-CM | POA: Diagnosis not present

## 2018-07-30 DIAGNOSIS — F330 Major depressive disorder, recurrent, mild: Secondary | ICD-10-CM | POA: Diagnosis not present

## 2018-07-30 DIAGNOSIS — F411 Generalized anxiety disorder: Secondary | ICD-10-CM | POA: Diagnosis not present

## 2018-08-06 DIAGNOSIS — F411 Generalized anxiety disorder: Secondary | ICD-10-CM | POA: Diagnosis not present

## 2018-08-06 DIAGNOSIS — F330 Major depressive disorder, recurrent, mild: Secondary | ICD-10-CM | POA: Diagnosis not present

## 2018-08-11 ENCOUNTER — Other Ambulatory Visit: Payer: Self-pay

## 2018-08-11 DIAGNOSIS — N926 Irregular menstruation, unspecified: Secondary | ICD-10-CM

## 2018-08-11 MED ORDER — MEGESTROL ACETATE 40 MG PO TABS: 40.0000 mg | ORAL_TABLET | Freq: Every day | ORAL | 0 refills | Status: DC

## 2018-08-14 ENCOUNTER — Ambulatory Visit (INDEPENDENT_AMBULATORY_CARE_PROVIDER_SITE_OTHER)
Admission: RE | Admit: 2018-08-14 | Discharge: 2018-08-14 | Disposition: A | Discharge status: Home / Self Care | Payer: BC Managed Care – PPO | Source: Ambulatory Visit

## 2018-08-14 DIAGNOSIS — K6289 Other specified diseases of anus and rectum: Secondary | ICD-10-CM

## 2018-08-14 DIAGNOSIS — Z8719 Personal history of other diseases of the digestive system: Secondary | ICD-10-CM | POA: Diagnosis not present

## 2018-08-14 DIAGNOSIS — K602 Anal fissure, unspecified: Secondary | ICD-10-CM

## 2018-08-14 MED ORDER — HYDROCORTISONE ACETATE 25 MG RE SUPP: 25.0000 mg | Freq: Two times a day (BID) | RECTAL | 0 refills | Status: DC

## 2018-08-14 MED ORDER — NITROGLYCERIN 0.4 % RE OINT: 1.0000 [in_us] | TOPICAL_OINTMENT | Freq: Two times a day (BID) | RECTAL | 0 refills | Status: DC

## 2018-08-14 NOTE — Discharge Instructions (Signed)
Perform sitz baths twice daily Apply nitroglycerin ointment internally twice daily If nitroglycerin not helping may use suppositories  Work on increasing water and fiber intake May take docusate/colace temporarily to help soften stool  Follow up in person if not resolving or worsening

## 2018-08-15 NOTE — ED Provider Notes (Signed)
Virtual Visit via Video Note:  Aliviana Burdell  initiated request for Telemedicine visit with [facility] team. I connected with [name]  on 08/15/2018 at 9:16 AM  for a synchronized telemedicine visit using a video enabled HIPPA compliant telemedicine application. I verified that I am speaking with [name]  using two identifiers. Torion Hulgan C Nikya Busler, PA-C  was physically located in a [facility] Urgent care site and Kenniyah Sasaki was located at a different location.   The limitations of evaluation and management by telemedicine as well as the availability of in-person appointments were discussed. Patient was informed that she  may incur a bill ( including co-pay) for this virtual visit encounter. [name]  expressed understanding and gave verbal consent to proceed with virtual visit.     History of Present Illness:Joan Lee  is a 28 y.o. female presents with concern over possible anal fissure.  Patient states that over the past few days she has developed a sharp pain in her rectum especially with bowel movements.  Initially was mainly with passing stool, but now has developed developed a dull ache otherwise and has discomfort with sitting.  She is also noticed some blood with wiping and some slight blood within the stool.  She does note that her bowels have been slightly hard and has been straining more recently.  Has history of previous anal fissure and symptoms feel similar.  Denies history of hemorrhoids, denies feeling any swelling rectally.  Denies any drainage of fluid other than blood.  Denies abdominal pain.  Past Medical History:  Diagnosis Date  . Common migraine with intractable migraine 04/20/2015    Allergies  Allergen Reactions  . Tamiflu [Oseltamivir Phosphate] Shortness Of Breath        Observations/Objective: Physical Exam  Constitutional: She is oriented to person, place, and time and well-developed, well-nourished, and in no distress.  No distress.  HENT:  Head: Normocephalic and atraumatic.  Neck: Normal range of motion. Neck supple.  Pulmonary/Chest: No respiratory distress.  Speaking in full sentences  Musculoskeletal: Normal range of motion.     Comments: Moving all extremities appropriately  Neurological: She is alert and oriented to person, place, and time.  Speech clear, face symmetric     Assessment and Plan: Anal fissure versus hemorrhoid, history of previous anal fissure, will treat as such with sits baths and nitroglycerin.  May use Anusol suppositories if not having relief with nitroglycerin.  Discussed recommendations to treat constipation as this likely triggering symptoms.  Recommended stool softeners, pushing fluids, increasing fiber, activity.  Follow-up in person for further evaluation if symptoms progressing or worsening.Discussed strict return precautions. Patient verbalized understanding and is agreeable with plan.   Follow Up Instructions:    I discussed the assessment and treatment plan with the patient. The patient was provided an opportunity to ask questions and all were answered. The patient agreed with the plan and demonstrated an understanding of the instructions.   The patient was advised to call back or seek an in-person evaluation if the symptoms worsen or if the condition fails to improve as anticipated.      [name], PA-C  08/15/2018 9:16 AM         [name], PA-C 08/15/18 [phone]

## 2018-08-20 DIAGNOSIS — F411 Generalized anxiety disorder: Secondary | ICD-10-CM | POA: Diagnosis not present

## 2018-08-20 DIAGNOSIS — F330 Major depressive disorder, recurrent, mild: Secondary | ICD-10-CM | POA: Diagnosis not present

## 2018-09-03 DIAGNOSIS — F330 Major depressive disorder, recurrent, mild: Secondary | ICD-10-CM | POA: Diagnosis not present

## 2018-09-03 DIAGNOSIS — F411 Generalized anxiety disorder: Secondary | ICD-10-CM | POA: Diagnosis not present

## 2018-09-04 ENCOUNTER — Ambulatory Visit (INDEPENDENT_AMBULATORY_CARE_PROVIDER_SITE_OTHER)
Admission: RE | Admit: 2018-09-04 | Discharge: 2018-09-04 | Disposition: A | Discharge status: Home / Self Care | Payer: BC Managed Care – PPO | Source: Ambulatory Visit

## 2018-09-04 DIAGNOSIS — K6289 Other specified diseases of anus and rectum: Secondary | ICD-10-CM

## 2018-09-04 NOTE — ED Provider Notes (Signed)
Virtual Visit via Video Note:  Joan Lee  initiated request for Telemedicine visit with [facility] team. I connected with Joan Lee  on 09/04/2018 at 7:39 PM  for a synchronized telemedicine visit using a video enabled HIPPA compliant telemedicine application. I verified that I am speaking with Joan Lee  using two identifiers. Joan C Wieters, PA-C  was physically located in a [facility] Urgent care site and Joan Lee was located at a different location.   The limitations of evaluation and management by telemedicine as well as the availability of in-person appointments were discussed. Patient was informed that she  may incur a bill ( including co-pay) for this virtual visit encounter. Joan Lee  expressed understanding and gave verbal consent to proceed with virtual visit.     History of Present Illness:Joan Lee  is a 28 y.o. female presents with rectal pain.  Patient states that over the past month she has had rectal pain initially mainly with passing bowel movements, but now has pain all the time.  She is also recently noticed swelling.  She was seen via virtual visit approximately 3 weeks ago and treated for anal fissure she has history of this with suppositories of Anusol as well as nitroglycerin and sitz bath's.  Symptoms did not improve.  They have now worsened.  She denies any drainage of pus.  Does have blood with wiping, but denies bleeding otherwise.  Past Medical History:  Diagnosis Date  . Common migraine with intractable migraine 04/20/2015    Allergies  Allergen Reactions  . Tamiflu [Oseltamivir Phosphate] Shortness Of Breath        Observations/Objective: Physical Exam  Constitutional: She is oriented to person, place, and time and well-developed, well-nourished, and in no distress. No distress.  HENT:  Head: Normocephalic and atraumatic.  Neck: Normal range of motion. Neck supple.  Pulmonary/Chest: Effort normal. No  respiratory distress.  Speaking full sentences  Neurological: She is alert and oriented to person, place, and time. GCS score is 15.  Speech clear, face symmetric  Nursing note and vitals reviewed.    Assessment and Plan:    ICD-10-CM   1. Rectal pain  K62.89      Recommended patient to have in person evaluation given symptoms not resolving with typical anal fissure remedies.  Recommended to have rectal exam to ensure proper treatment.  Patient verbalized understanding.  Follow Up Instructions:     I discussed the assessment and treatment plan with the patient. The patient was provided an opportunity to ask questions and all were answered. The patient agreed with the plan and demonstrated an understanding of the instructions.   The patient was advised to call back or seek an in-person evaluation if the symptoms worsen or if the condition fails to improve as anticipated.      [name], PA-C  09/04/2018 7:39 PM        [name], PA-C 09/04/18 [phone]

## 2018-09-04 NOTE — Discharge Instructions (Signed)
Please come in to be seen in person 

## 2018-09-07 ENCOUNTER — Other Ambulatory Visit: Payer: Self-pay

## 2018-09-07 ENCOUNTER — Ambulatory Visit (HOSPITAL_COMMUNITY)
Admission: EM | Admit: 2018-09-07 | Discharge: 2018-09-07 | Disposition: A | Discharge status: Home / Self Care | Payer: BC Managed Care – PPO | Attending: Emergency Medicine | Admitting: Emergency Medicine

## 2018-09-07 ENCOUNTER — Encounter (HOSPITAL_COMMUNITY): Payer: Self-pay

## 2018-09-07 DIAGNOSIS — K649 Unspecified hemorrhoids: Secondary | ICD-10-CM

## 2018-09-07 MED ORDER — HYDROCORTISONE (PERIANAL) 2.5 % EX CREA: 1.0000 "application " | TOPICAL_CREAM | Freq: Two times a day (BID) | CUTANEOUS | 0 refills | Status: DC

## 2018-09-07 NOTE — Discharge Instructions (Signed)
Exam is consistent with hemorrhoid.  Continue with stool softener to keep stool soft.  Drink plenty of water.  Suppositories and/or topical anusol or nitroglycerin for comfort.  Sitz baths.  Continue to follow up with your primary care provider as needed for persistent symptoms.

## 2018-09-07 NOTE — ED Triage Notes (Signed)
Pt present anal bleeding. Symptoms has been going on for over a month. Pt states it feels like knifes are poking at her every time she has a bowel movement.

## 2018-09-07 NOTE — ED Provider Notes (Signed)
[place]    CSN: [id number] Arrival date & time: 09/07/18  1613     History   Chief Complaint Chief Complaint  Patient presents with  . Hemorrhoids    HPI Joan Lee is a 28 y.o. female.   Joan Lee presents with complaints of persistent rectal pain and bleeding. Started approximately 1 month ago. Has been trying suppositories as well as topical nitro which hasn't with symptoms. Has taken stool softeners but recently stopped. Has a BM daily, states it is normal in consistency, doesn't have to strain to pain. No pelvic or abdominal pain. No nausea or vomiting. Normal urination. States occasionally notes bright red blood to the stool, as well as sometimes with wiping. No dizziness or weakness. States has had rectal fissure in the past but no history of hemorrhoids.  States the pain is sharp to her rectum which makes her not want to go to the toilet. Without contributing medical history.    ROS per HPI, negative if not otherwise mentioned.      Past Medical History:  Diagnosis Date  . Common migraine with intractable migraine 04/20/2015    Patient Active Problem List   Diagnosis Date Noted  . Common migraine with intractable migraine 04/20/2015    Past Surgical History:  Procedure Laterality Date  . WISDOM TOOTH EXTRACTION      OB History    Gravida  0   Para  0   Term  0   Preterm  0   AB  0   Living  0     SAB  0   TAB  0   Ectopic  0   Multiple  0   Live Births  0            Home Medications    Prior to Admission medications   Medication Sig Start Date End Date Taking? Authorizing Provider  clotrimazole-betamethasone (LOTRISONE) cream Apply 1 application topically 2 (two) times daily. Patient not taking: Reported on 06/23/2018 05/30/18   Truett Mainland, DO  FLUoxetine (PROZAC) 20 MG tablet Take 1 tablet (20 mg total) by mouth daily. Increase to 40 mg after 2-3 weeks 07/02/18   Copland, Gay Filler, MD  hydrocortisone  (ANUSOL-HC) 2.5 % rectal cream Place 1 application rectally 2 (two) times daily. 09/07/18   Zigmund Gottron, NP  hydrocortisone (ANUSOL-HC) 25 MG suppository Place 1 suppository (25 mg total) rectally 2 (two) times daily. 08/14/18   Wieters, Hallie C, PA-C  levonorgestrel-ethinyl estradiol (AVIANE,ALESSE,LESSINA) 0.1-20 MG-MCG tablet Take 1 tablet by mouth daily. May take active pills continuously for 42 or 63 days 09/23/17   Copland, Gay Filler, MD  levonorgestrel-ethinyl estradiol (AVIANE,ALESSE,LESSINA) 0.1-20 MG-MCG tablet Take 1 tablet by mouth daily. 12/02/17   Copland, Gay Filler, MD  megestrol (MEGACE) 40 MG tablet Take 1 tablet (40 mg total) by mouth daily. Can increase to two tablets twice a day in the event of heavy bleeding 08/11/18   Lavonia Drafts, MD  metroNIDAZOLE (FLAGYL) 500 MG tablet Take 1 tablet (500 mg total) by mouth 2 (two) times daily. 06/03/18   Truett Mainland, DO  Nitroglycerin 0.4 % OINT Place 1 inch rectally every 12 (twelve) hours. 08/14/18   Wieters, Elesa Hacker, PA-C    Family History Family History  Problem Relation Age of Onset  . Diabetes Mother   . Migraines Mother   . High blood pressure Mother   . Bell's palsy Father   . Bell's palsy Sister  Social History Social History   Tobacco Use  . Smoking status: Never Smoker  . Smokeless tobacco: Never Used  Substance Use Topics  . Alcohol use: No  . Drug use: No     Allergies   Tamiflu [oseltamivir phosphate]   Review of Systems Review of Systems   Physical Exam Triage Vital Signs ED Triage Vitals [09/07/18 1708]  Enc Vitals Group     BP 131/82     Pulse Rate 74     Resp 16     Temp 98.9 F (37.2 C)     Temp Source Oral     SpO2 100 %     Weight      Height      Head Circumference      Peak Flow      Pain Score 10     Pain Loc      Pain Edu?      Excl. in GC?    No data found.  Updated Vital Signs BP 131/82 (BP Location: Left Arm)   Pulse 74   Temp 98.9 F (37.2 C)  (Oral)   Resp 16   LMP 08/25/2018   SpO2 100%    Physical Exam Constitutional:      General: She is not in acute distress.    Appearance: She is well-developed.  Cardiovascular:     Rate and Rhythm: Normal rate.  Pulmonary:     Effort: Pulmonary effort is normal.  Abdominal:     Tenderness: There is no abdominal tenderness.  Genitourinary:    Rectum: External hemorrhoid present.     Comments: No obvious visible anal fissure, 1 small ext hemorrhoid; no frank blood with DRE  Skin:    General: Skin is warm and dry.  Neurological:     Mental Status: She is alert and oriented to person, place, and time.      UC Treatments / Results  Labs (all labs ordered are listed, but only abnormal results are displayed) Labs Reviewed - No data to display  EKG   Radiology No results found.  Procedures Procedures (including critical care time)  Medications Ordered in UC Medications - No data to display  Initial Impression / Assessment and Plan / UC Course  I have reviewed the triage vital signs and the nursing notes.  Pertinent labs & imaging results that were available during my care of the patient were reviewed by me and considered in my medical decision making (see chart for details).     History and physical consistent with hemorrhoid. Discussed bowel care, sitz baths, topical treatments for comfort. Follow up with pcp for persistent symptoms, may need referral for any worsening or persistent symptoms. Patient verbalized understanding and agreeable to plan.   Final Clinical Impressions(s) / UC Diagnoses   Final diagnoses:  Hemorrhoids, unspecified hemorrhoid type     Discharge Instructions     Exam is consistent with hemorrhoid.  Continue with stool softener to keep stool soft.  Drink plenty of water.  Suppositories and/or topical anusol or nitroglycerin for comfort.  Sitz baths.  Continue to follow up with your primary care provider as needed for persistent  symptoms.     ED Prescriptions    Medication Sig Dispense Auth. Provider   hydrocortisone (ANUSOL-HC) 2.5 % rectal cream Place 1 application rectally 2 (two) times daily. 30 g Georgetta HaberBurky, Erskine Steinfeldt B, NP     Controlled Substance Prescriptions [place] Controlled Substance Registry consulted? Not Applicable   Georgetta HaberBurky, Mehtaab Mayeda B, NP  09/07/18 1907  

## 2018-09-15 ENCOUNTER — Ambulatory Visit: Payer: BLUE CROSS/BLUE SHIELD | Admitting: Neurology

## 2018-09-26 ENCOUNTER — Encounter (INDEPENDENT_AMBULATORY_CARE_PROVIDER_SITE_OTHER): Payer: Self-pay

## 2018-10-10 ENCOUNTER — Telehealth: Payer: Self-pay

## 2018-10-10 DIAGNOSIS — N926 Irregular menstruation, unspecified: Secondary | ICD-10-CM

## 2018-10-10 DIAGNOSIS — N939 Abnormal uterine and vaginal bleeding, unspecified: Secondary | ICD-10-CM

## 2018-10-10 MED ORDER — NORGESTIMATE-ETH ESTRADIOL 0.25-35 MG-MCG PO TABS: 1.0000 | ORAL_TABLET | Freq: Every day | ORAL | 3 refills | Status: DC

## 2018-10-10 NOTE — Telephone Encounter (Signed)
Pt sent my chart message about AUB. Per Dr. Ihor Dow , pt can cont with the Megace (she will need a refill) or switch to OCPs (if so she will need Sprintec 1 po q day RF x 3). Pt states she wants to continue OCPs. Sprintec was sent to pharmacy.Pt advised to take one tab daily. Understanding was voiced.Gearldean Lomanto l Zuriah Bordas, CMA

## 2018-10-13 DIAGNOSIS — F411 Generalized anxiety disorder: Secondary | ICD-10-CM | POA: Diagnosis not present

## 2018-10-13 DIAGNOSIS — F330 Major depressive disorder, recurrent, mild: Secondary | ICD-10-CM | POA: Diagnosis not present

## 2018-10-17 DIAGNOSIS — F411 Generalized anxiety disorder: Secondary | ICD-10-CM | POA: Diagnosis not present

## 2018-10-17 DIAGNOSIS — F330 Major depressive disorder, recurrent, mild: Secondary | ICD-10-CM | POA: Diagnosis not present

## 2018-10-21 NOTE — Progress Notes (Addendum)
[facility] [address] [place], [place] [address] [phone]  Date:  10/23/2018   Name:  Joan Lee   DOB:  02-02-1991   MRN:  [id number]  PCP:  Pearline Cables, MD    Chief Complaint: Annual Exam (flu shot, declined pap)   History of Present Illness:  Joan Lee is a 28 y.o. very pleasant female patient who presents with the following:  Generally healthy young woman with history of migraine headache.  Here today for physical exam Last seen by myself in May with concern of depression At that time she was seeing a therapist and wished to start medication for symptoms of depression and some anxiety.  We had her start on Prozac 20 mg, with plans to increase to 40 mg She feels like this is working pretty well for her She finished up therapy, is now just taking her fluoxetine.  She is happy with her results  They got a puppy recently- she is up a lot at night right now to take her out so she is somewhat tired  She was also recently seen in urgent care with concern of hemorrhoids Due for routine labs, HIV screening Flu shot due- give today Can do Pap today if she would like-last done in October 2017, normal- pt prefers to do later  She follows up with GYN for irregular menstrual periods- they recently changed her OCP and they are continuing to follow  She works at [employer]- they are on a reduced team, working fewer hours but being paid for full time   Patient Active Problem List   Diagnosis Date Noted  . Common migraine with intractable migraine 04/20/2015    Past Medical History:  Diagnosis Date  . Common migraine with intractable migraine 04/20/2015    Past Surgical History:  Procedure Laterality Date  . WISDOM TOOTH EXTRACTION      Social History   Tobacco Use  . Smoking status: Never Smoker  . Smokeless tobacco: Never Used  Substance Use Topics  . Alcohol use: No  . Drug use: No     Family History  Problem Relation Age of Onset  . Diabetes Mother   . Migraines Mother   . High blood pressure Mother   . Bell's palsy Father   . Bell's palsy Sister     Allergies  Allergen Reactions  . Tamiflu [Oseltamivir Phosphate] Shortness Of Breath    Medication list has been reviewed and updated.  Current Outpatient Medications on File Prior to Visit  Medication Sig Dispense Refill  . levonorgestrel-ethinyl estradiol (AVIANE,ALESSE,LESSINA) 0.1-20 MG-MCG tablet Take 1 tablet by mouth daily. May take active pills continuously for 42 or 63 days 3 Package 4   No current facility-administered medications on file prior to visit.     Review of Systems:  As per HPI- otherwise negative. No skin changes No breast lumps or bumps She is getting more exercise by walking with her dog No CP or SOB   Physical Examination: Vitals:   10/23/18 0908  BP: 126/80  Pulse: 82  Resp: 16  Temp: (!) 97.5 F (36.4 C)  SpO2: 97%   Vitals:   10/23/18 0908  Weight: (!) 302 lb (137 kg)  Height: 5\' 8"  (1.727 m)   Body mass index is 45.92 kg/m. Ideal Body Weight: Weight in (lb) to have BMI = 25: 164.1  GEN: WDWN, NAD, Non-toxic, A & O x 3, obese, looks well  HEENT: Atraumatic, Normocephalic. Neck supple. No masses, No LAD. Ears and Nose: No external deformity. CV: RRR, No M/G/R. No JVD. No thrill. No extra heart sounds. PULM: CTA B, no wheezes, crackles, rhonchi. No retractions. No resp. distress. No accessory muscle use. ABD: S, NT, ND, +BS. No rebound. No HSM. EXTR: No c/c/e NEURO Normal gait.  PSYCH: Normally interactive. Conversant. Not depressed or anxious appearing.  Calm demeanor.    Assessment and Plan:   ICD-10-CM   1. Physical exam  Z00.00   2. Screening for hyperlipidemia  Z13.220 Lipid panel  3. Immunization due  Z23 Flu Vaccine QUAD 6+ mos PF IM (Fluarix Quad PF)  4. Screening for cervical cancer  Z12.4   5. Screening for HIV (human immunodeficiency virus)   Z11.4 HIV Antibody (routine testing w rflx)  6. Screening for diabetes mellitus  Z13.1 Comprehensive metabolic panel    Hemoglobin A1c  7. Screening for deficiency anemia  Z13.0 CBC  8. Depression, recurrent (HCC)  F33.9 FLUoxetine (PROZAC) 40 MG capsule  9. Menstrual irregularity  N92.6 TSH  10. Class 3 severe obesity without serious comorbidity with body mass index (BMI) of 40.0 to 44.9 in adult, unspecified obesity type ([place])  E66.01    Z68.41    Here today for physical exam Flu shot given Labs pain as above Discussed her fluoxetine.  She is currently doing well on 40 mg.  I provided refills for a year, advised her that she may continue to take this medication as long as she feels it is necessary and helpful.  If she does wish to stop it at some point, discussed how to taper the medication  Encouraged her to continue exercise Will plan further follow- up pending labs.  Wt Readings from Last 3 Encounters:  10/23/18 (!) 302 lb (137 kg)  06/26/18 294 lb (133.4 kg)  06/23/18 294 lb 1.3 oz (133.4 kg)      Follow-up: No follow-ups on file.  Meds ordered this encounter  Medications  . FLUoxetine (PROZAC) 40 MG capsule    Sig: Take 1 capsule (40 mg total) by mouth daily.    Dispense:  90 capsule    Refill:  3   Orders Placed This Encounter  Procedures  . Flu Vaccine QUAD 6+ mos PF IM (Fluarix Quad PF)  . CBC  . Comprehensive metabolic panel  . Hemoglobin A1c  . Lipid panel  . HIV Antibody (routine testing w rflx)  . TSH    @SIGN @  Outpatient Encounter Medications as of 10/23/2018  Medication Sig  . levonorgestrel-ethinyl estradiol (AVIANE,ALESSE,LESSINA) 0.1-20 MG-MCG tablet Take 1 tablet by mouth daily. May take active pills continuously for 42 or 63 days  . [DISCONTINUED] FLUoxetine (PROZAC) 20 MG tablet Take 1 tablet (20 mg total) by mouth daily. Increase to 40 mg after 2-3 weeks  . FLUoxetine (PROZAC) 40 MG capsule Take 1 capsule (40 mg total) by mouth daily.  .  [DISCONTINUED] clotrimazole-betamethasone (LOTRISONE) cream Apply 1 application topically 2 (two) times daily. (Patient not taking: Reported on 06/23/2018)  . [DISCONTINUED] hydrocortisone (ANUSOL-HC) 2.5 % rectal cream Place 1 application rectally 2 (two) times daily.  . [DISCONTINUED] hydrocortisone (ANUSOL-HC) 25 MG suppository Place 1 suppository (25 mg total) rectally 2 (two) times daily.  . [DISCONTINUED] levonorgestrel-ethinyl estradiol (AVIANE,ALESSE,LESSINA) 0.1-20 MG-MCG tablet Take 1 tablet by mouth daily.  . [DISCONTINUED] megestrol (MEGACE) 40 MG tablet Take 1 tablet (40 mg total) by mouth daily. Can increase to two tablets twice a day in the event  of heavy bleeding  . [DISCONTINUED] metroNIDAZOLE (FLAGYL) 500 MG tablet Take 1 tablet (500 mg total) by mouth 2 (two) times daily.  . [DISCONTINUED] Nitroglycerin 0.4 % OINT Place 1 inch rectally every 12 (twelve) hours.  . [DISCONTINUED] norgestimate-ethinyl estradiol (SPRINTEC 28) 0.25-35 MG-MCG tablet Take 1 tablet by mouth daily.   No facility-administered encounter medications on file as of 10/23/2018.     Signed Abbe AmsterdamJessica Deicy Rusk, MD  Received her labs, message to patient Results for orders placed or performed in visit on 10/23/18  CBC  Result Value Ref Range   WBC 7.9 4.0 - 10.5 K/uL   RBC 4.33 3.87 - 5.11 Mil/uL   Platelets 315.0 150.0 - 400.0 K/uL   Hemoglobin 13.0 12.0 - 15.0 g/dL   HCT 16.139.4 09.636.0 - 04.546.0 %   MCV 91.1 78.0 - 100.0 fl   MCHC 33.1 30.0 - 36.0 g/dL   RDW 40.913.2 81.111.5 - 91.415.5 %  Comprehensive metabolic panel  Result Value Ref Range   Sodium 138 135 - 145 mEq/L   Potassium 4.2 3.5 - 5.1 mEq/L   Chloride 106 96 - 112 mEq/L   CO2 24 19 - 32 mEq/L   Glucose, Bld 75 70 - 99 mg/dL   BUN 10 6 - 23 mg/dL   Creatinine, Ser 7.820.79 0.40 - 1.20 mg/dL   Total Bilirubin 0.3 0.2 - 1.2 mg/dL   Alkaline Phosphatase 63 39 - 117 U/L   AST 14 0 - 37 U/L   ALT 10 0 - 35 U/L   Total Protein 6.9 6.0 - 8.3 g/dL   Albumin 4.0 3.5 -  5.2 g/dL   Calcium 8.6 8.4 - 95.610.5 mg/dL   GFR 213.08106.27 >65.78>60.00 mL/min  Hemoglobin A1c  Result Value Ref Range   Hgb A1c MFr Bld 5.1 4.6 - 6.5 %  Lipid panel  Result Value Ref Range   Cholesterol 187 0 - 200 mg/dL   Triglycerides 46.992.0 0.0 - 149.0 mg/dL   HDL 62.9554.60 >28.41>39.00 mg/dL   VLDL 32.418.4 0.0 - 40.140.0 mg/dL   LDL Cholesterol 027114 (H) 0 - 99 mg/dL   Total CHOL/HDL Ratio 3    NonHDL 132.45   TSH  Result Value Ref Range   TSH 1.60 0.35 - 4.50 uIU/mL

## 2018-10-21 NOTE — Patient Instructions (Signed)
It was great to see you again today, I will be in touch with your labs ASAP  You got your flu shot today Continue prozac 40 mg  If you have any issues with mood please alert me  Continue to work on diet and exercise. congrats on your sweet and beautiful puppy!     Health Maintenance, Female Adopting a healthy lifestyle and getting preventive care are important in promoting health and wellness. Ask your health care provider about:  The right schedule for you to have regular tests and exams.  Things you can do on your own to prevent diseases and keep yourself healthy. What should I know about diet, weight, and exercise? Eat a healthy diet   Eat a diet that includes plenty of vegetables, fruits, low-fat dairy products, and lean protein.  Do not eat a lot of foods that are high in solid fats, added sugars, or sodium. Maintain a healthy weight Body mass index (BMI) is used to identify weight problems. It estimates body fat based on height and weight. Your health care provider can help determine your BMI and help you achieve or maintain a healthy weight. Get regular exercise Get regular exercise. This is one of the most important things you can do for your health. Most adults should:  Exercise for at least 150 minutes each week. The exercise should increase your heart rate and make you sweat (moderate-intensity exercise).  Do strengthening exercises at least twice a week. This is in addition to the moderate-intensity exercise.  Spend less time sitting. Even light physical activity can be beneficial. Watch cholesterol and blood lipids Have your blood tested for lipids and cholesterol at 26 years of age, then have this test every 5 years. Have your cholesterol levels checked more often if:  Your lipid or cholesterol levels are high.  You are older than 26 years of age.  You are at high risk for heart disease. What should I know about cancer screening? Depending on your health  history and family history, you may need to have cancer screening at various ages. This may include screening for:  Breast cancer.  Cervical cancer.  Colorectal cancer.  Skin cancer.  Lung cancer. What should I know about heart disease, diabetes, and high blood pressure? Blood pressure and heart disease  High blood pressure causes heart disease and increases the risk of stroke. This is more likely to develop in people who have high blood pressure readings, are of African descent, or are overweight.  Have your blood pressure checked: ? Every 3-5 years if you are 29-73 years of age. ? Every year if you are 70 years old or older. Diabetes Have regular diabetes screenings. This checks your fasting blood sugar level. Have the screening done:  Once every three years after age 47 if you are at a normal weight and have a low risk for diabetes.  More often and at a younger age if you are overweight or have a high risk for diabetes. What should I know about preventing infection? Hepatitis B If you have a higher risk for hepatitis B, you should be screened for this virus. Talk with your health care provider to find out if you are at risk for hepatitis B infection. Hepatitis C Testing is recommended for:  Everyone born from [birth year] through 1963.  Anyone with known risk factors for hepatitis C. Sexually transmitted infections (STIs)  Get screened for STIs, including gonorrhea and chlamydia, if: ? You are sexually active and are younger  than 26 years of age. ? You are older than 26 years of age and your health care provider tells you that you are at risk for this type of infection. ? Your sexual activity has changed since you were last screened, and you are at increased risk for chlamydia or gonorrhea. Ask your health care provider if you are at risk.  Ask your health care provider about whether you are at high risk for HIV. Your health care provider may recommend a prescription medicine to  help prevent HIV infection. If you choose to take medicine to prevent HIV, you should first get tested for HIV. You should then be tested every 3 months for as long as you are taking the medicine. Pregnancy  If you are about to stop having your period (premenopausal) and you may become pregnant, seek counseling before you get pregnant.  Take 400 to 800 micrograms (mcg) of folic acid every day if you become pregnant.  Ask for birth control (contraception) if you want to prevent pregnancy. Osteoporosis and menopause Osteoporosis is a disease in which the bones lose minerals and strength with aging. This can result in bone fractures. If you are 35 years old or older, or if you are at risk for osteoporosis and fractures, ask your health care provider if you should:  Be screened for bone loss.  Take a calcium or vitamin D supplement to lower your risk of fractures.  Be given hormone replacement therapy (HRT) to treat symptoms of menopause. Follow these instructions at home: Lifestyle  Do not use any products that contain nicotine or tobacco, such as cigarettes, e-cigarettes, and chewing tobacco. If you need help quitting, ask your health care provider.  Do not use street drugs.  Do not share needles.  Ask your health care provider for help if you need support or information about quitting drugs. Alcohol use  Do not drink alcohol if: ? Your health care provider tells you not to drink. ? You are pregnant, may be pregnant, or are planning to become pregnant.  If you drink alcohol: ? Limit how much you use to 0-1 drink a day. ? Limit intake if you are breastfeeding.  Be aware of how much alcohol is in your drink. In the [place] one drink equals one 12 oz bottle of beer (355 mL), one 5 oz glass of wine (148 mL), or one 1 oz glass of hard liquor (44 mL). General instructions  Schedule regular health, dental, and eye exams.  Stay current with your vaccines.  Tell your health care  provider if: ? You often feel depressed. ? You have ever been abused or do not feel safe at home. Summary  Adopting a healthy lifestyle and getting preventive care are important in promoting health and wellness.  Follow your health care provider's instructions about healthy diet, exercising, and getting tested or screened for diseases.  Follow your health care provider's instructions on monitoring your cholesterol and blood pressure. This information is not intended to replace advice given to you by your health care provider. Make sure you discuss any questions you have with your health care provider. Document Released: 08/28/2010 Document Revised: 02/05/2018 Document Reviewed: 02/05/2018 Elsevier Patient Education  2020 [employer].

## 2018-10-22 ENCOUNTER — Other Ambulatory Visit: Payer: Self-pay

## 2018-10-23 ENCOUNTER — Encounter: Payer: Self-pay | Admitting: Family Medicine

## 2018-10-23 ENCOUNTER — Ambulatory Visit (INDEPENDENT_AMBULATORY_CARE_PROVIDER_SITE_OTHER): Payer: BC Managed Care – PPO | Admitting: Family Medicine

## 2018-10-23 VITALS — BP 126/80 | HR 82 | Temp 97.5°F | Resp 16 | Ht 68.0 in | Wt 302.0 lb

## 2018-10-23 DIAGNOSIS — N926 Irregular menstruation, unspecified: Secondary | ICD-10-CM | POA: Diagnosis not present

## 2018-10-23 DIAGNOSIS — Z131 Encounter for screening for diabetes mellitus: Secondary | ICD-10-CM

## 2018-10-23 DIAGNOSIS — F339 Major depressive disorder, recurrent, unspecified: Secondary | ICD-10-CM

## 2018-10-23 DIAGNOSIS — Z13220 Encounter for screening for lipoid disorders: Secondary | ICD-10-CM

## 2018-10-23 DIAGNOSIS — Z124 Encounter for screening for malignant neoplasm of cervix: Secondary | ICD-10-CM | POA: Diagnosis not present

## 2018-10-23 DIAGNOSIS — Z114 Encounter for screening for human immunodeficiency virus [HIV]: Secondary | ICD-10-CM

## 2018-10-23 DIAGNOSIS — Z0000 Encounter for general adult medical examination without abnormal findings: Secondary | ICD-10-CM | POA: Diagnosis not present

## 2018-10-23 DIAGNOSIS — Z6841 Body Mass Index (BMI) 40.0 and over, adult: Secondary | ICD-10-CM

## 2018-10-23 DIAGNOSIS — Z130 Encounter for screening for diseases of the blood and blood-forming organs and certain disorders involving the immune mechanism: Secondary | ICD-10-CM

## 2018-10-23 DIAGNOSIS — Z23 Encounter for immunization: Secondary | ICD-10-CM

## 2018-10-23 LAB — CBC
HCT: 39.4 % (ref 36.0–46.0)
Hemoglobin: 13 g/dL (ref 12.0–15.0)
MCHC: 33.1 g/dL (ref 30.0–36.0)
MCV: 91.1 fl (ref 78.0–100.0)
Platelets: 315 10*3/uL (ref 150.0–400.0)
RBC: 4.33 Mil/uL (ref 3.87–5.11)
RDW: 13.2 % (ref 11.5–15.5)
WBC: 7.9 10*3/uL (ref 4.0–10.5)

## 2018-10-23 LAB — COMPREHENSIVE METABOLIC PANEL
ALT: 10 U/L (ref 0–35)
AST: 14 U/L (ref 0–37)
Albumin: 4 g/dL (ref 3.5–5.2)
Alkaline Phosphatase: 63 U/L (ref 39–117)
BUN: 10 mg/dL (ref 6–23)
CO2: 24 mEq/L (ref 19–32)
Calcium: 8.6 mg/dL (ref 8.4–10.5)
Chloride: 106 mEq/L (ref 96–112)
Creatinine, Ser: 0.79 mg/dL (ref 0.40–1.20)
GFR: 106.27 mL/min (ref >60.00)
Glucose, Bld: 75 mg/dL (ref 70–99)
Potassium: 4.2 mEq/L (ref 3.5–5.1)
Sodium: 138 mEq/L (ref 135–145)
Total Bilirubin: 0.3 mg/dL (ref 0.2–1.2)
Total Protein: 6.9 g/dL (ref 6.0–8.3)

## 2018-10-23 LAB — LIPID PANEL
Cholesterol: 187 mg/dL (ref 0–200)
HDL: 54.6 mg/dL (ref >39.00)
LDL Cholesterol: 114 mg/dL — ABNORMAL HIGH (ref 0–99)
NonHDL: 132.45
Total CHOL/HDL Ratio: 3
Triglycerides: 92 mg/dL (ref 0.0–149.0)
VLDL: 18.4 mg/dL (ref 0.0–40.0)

## 2018-10-23 LAB — TSH: TSH: 1.6 u[IU]/mL (ref 0.35–4.50)

## 2018-10-23 LAB — HEMOGLOBIN A1C: Hgb A1c MFr Bld: 5.1 % (ref 4.6–6.5)

## 2018-10-23 MED ORDER — FLUOXETINE HCL 40 MG PO CAPS: 40.0000 mg | ORAL_CAPSULE | Freq: Every day | ORAL | 3 refills | Status: DC

## 2018-10-24 LAB — HIV ANTIBODY (ROUTINE TESTING W REFLEX): HIV 1&2 Ab, 4th Generation: NONREACTIVE

## 2019-01-11 ENCOUNTER — Other Ambulatory Visit: Payer: Self-pay | Admitting: Family Medicine

## 2019-01-11 DIAGNOSIS — F339 Major depressive disorder, recurrent, unspecified: Secondary | ICD-10-CM

## 2019-01-16 ENCOUNTER — Other Ambulatory Visit: Payer: Self-pay

## 2019-01-16 MED ORDER — NORGESTIMATE-ETH ESTRADIOL 0.25-35 MG-MCG PO TABS: 1.0000 | ORAL_TABLET | Freq: Every day | ORAL | 11 refills | Status: DC

## 2019-01-18 IMAGING — CT CT RENAL STONE PROTOCOL
2 of 3 series · 16 of 46 positions shown, 18 images · non-contrast
Comparison: None.

CLINICAL DATA: Acute onset of lower abdominal pressure and left
flank pain. Nausea and vomiting. Initial encounter.

EXAM:
CT ABDOMEN AND PELVIS WITHOUT CONTRAST
TECHNIQUE: Multidetector CT imaging of the abdomen and pelvis was performed
following the standard protocol without IV contrast.

[Series 3: lung · axial · 0.69mm/px · z∈[-40,+36]mm · 13 of 44 slices shown, 15 images]
[im 3/44  soft-tissue]
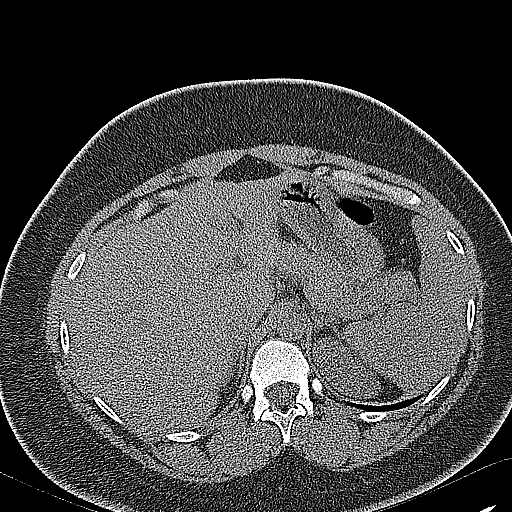
[im 3/44  bone]
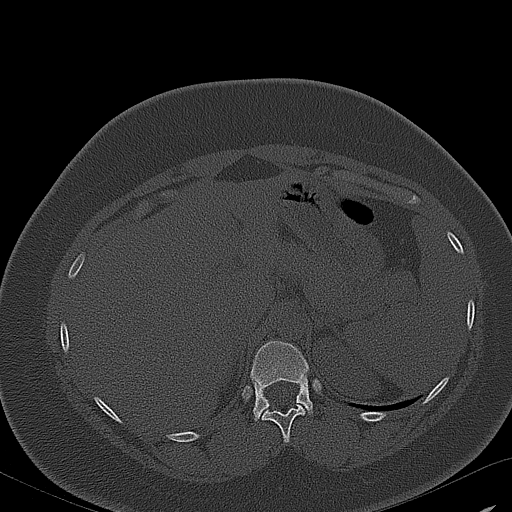
[im 6/44  soft-tissue]
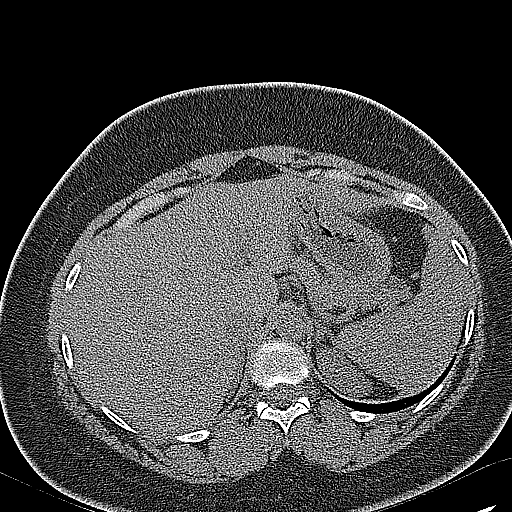
[im 9/44  soft-tissue]
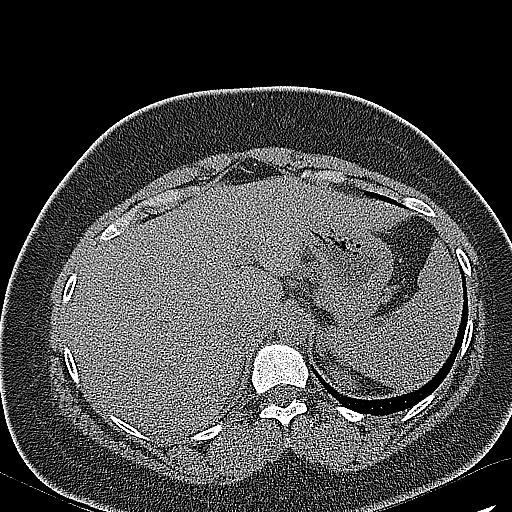
[im 13/44  soft-tissue]
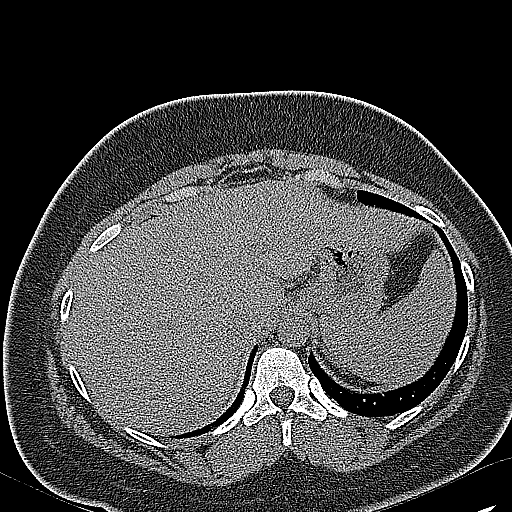
[im 16/44  soft-tissue]
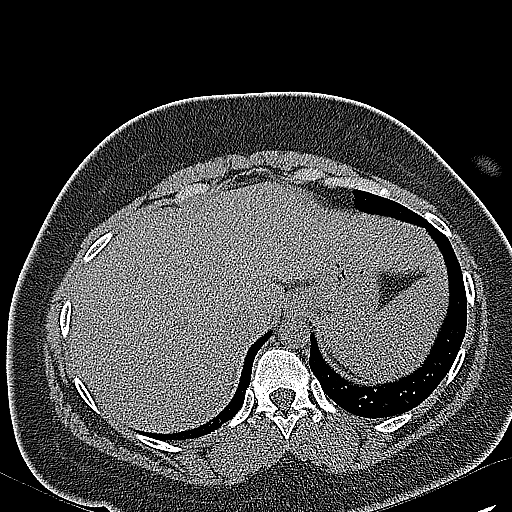
[im 19/44  soft-tissue]
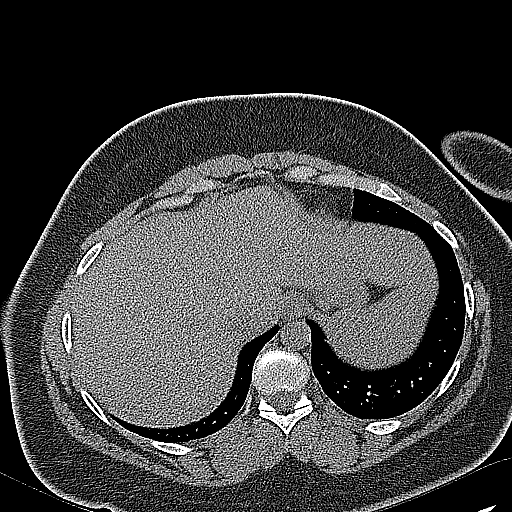
[im 23/44  soft-tissue]
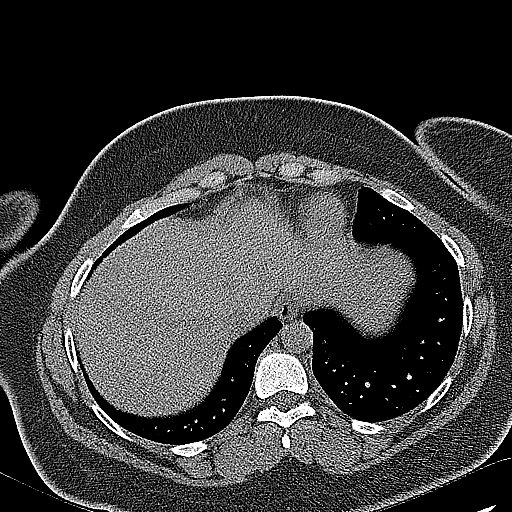
[im 25/44  soft-tissue]
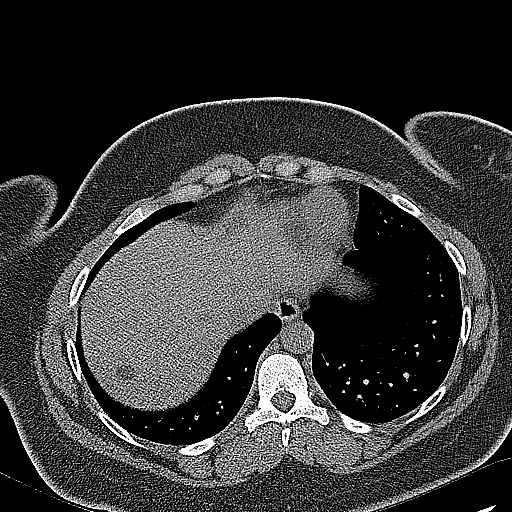
[im 28/44  soft-tissue]
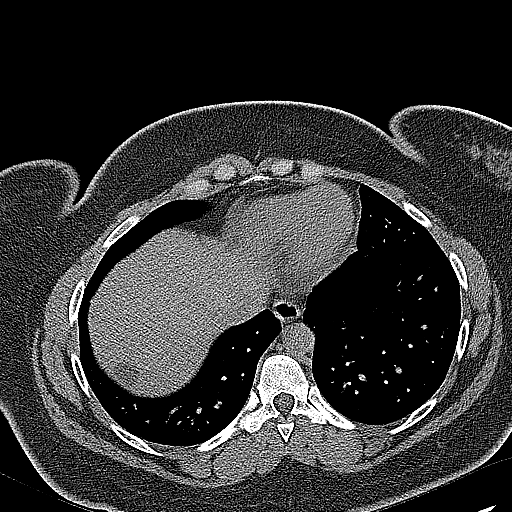
[im 28/44  bone]
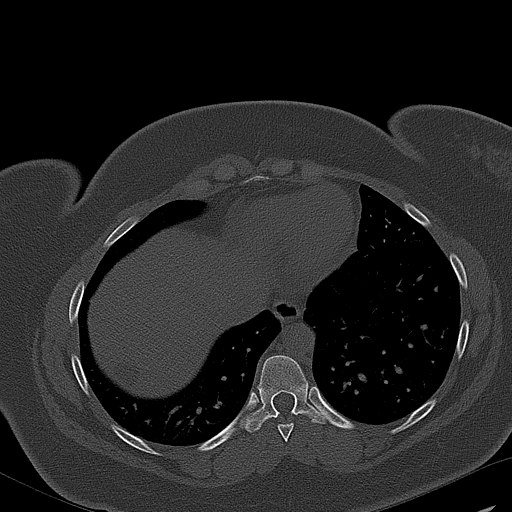
[im 31/44  soft-tissue]
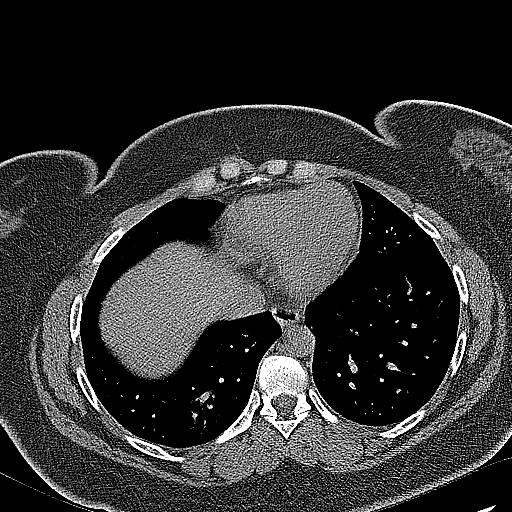
[im 35/44  soft-tissue]
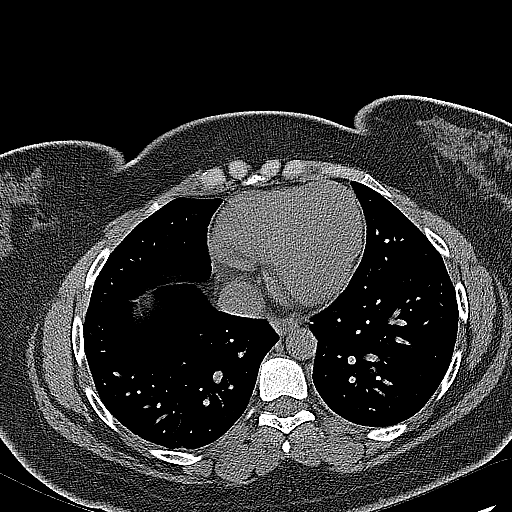
[im 38/44  soft-tissue]
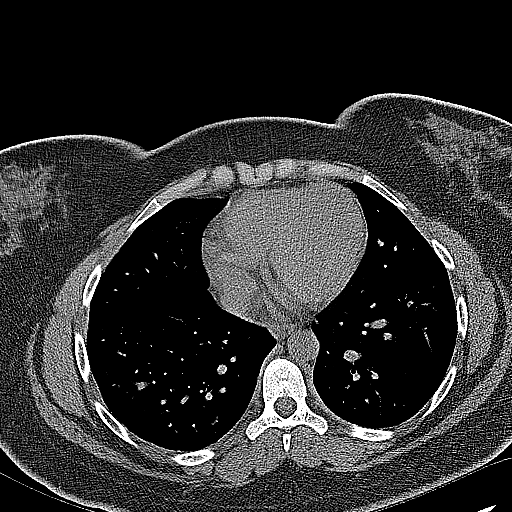
[im 41/44  soft-tissue]
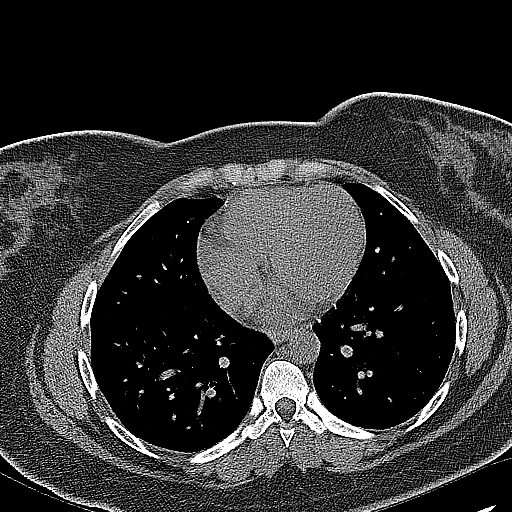

[Series 4: coronal · coronal · 0.70mm/px · 3 of 139 slices shown]
[im 47/139  soft-tissue]
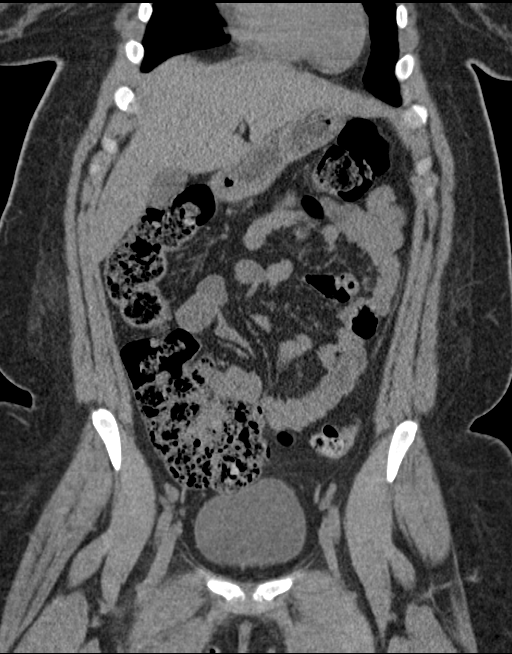
[im 62/139  soft-tissue]
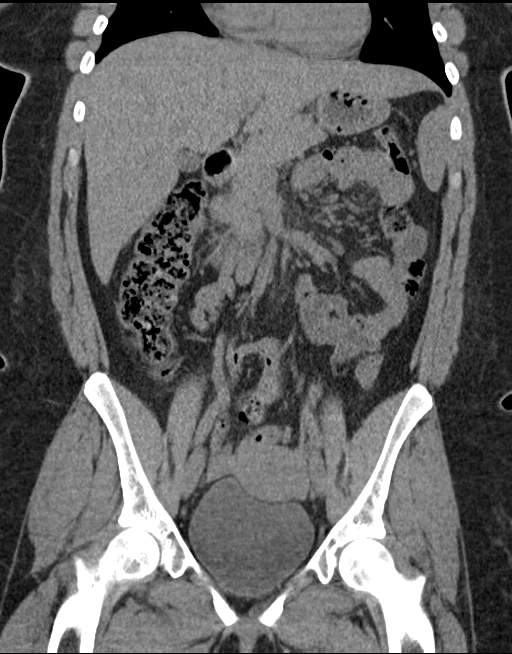
[im 77/139  soft-tissue]
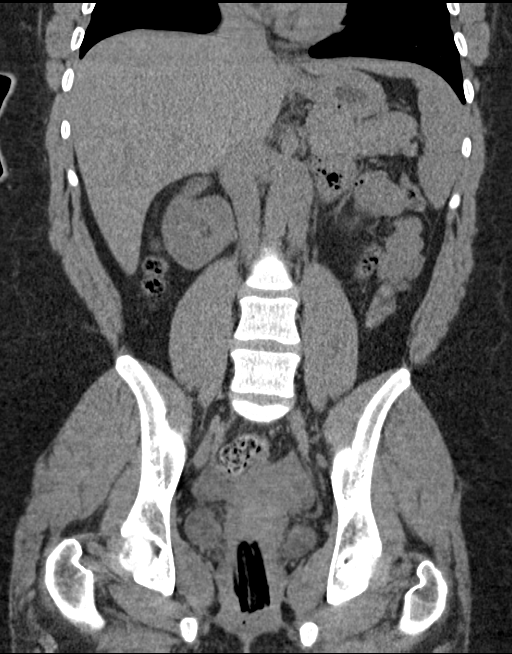

[16 of 46 positions shown; findings below may reference images not displayed]

FINDINGS: Lower chest: The visualized lung bases are grossly clear. The
visualized portions of the mediastinum are unremarkable.

Hepatobiliary: A 1.8 cm hypodensity is noted at the right hepatic
lobe. The liver is otherwise unremarkable. The gallbladder is
grossly unremarkable. The common bile duct remains normal in
caliber.

Pancreas: The pancreas is within normal limits.

Spleen: The spleen is unremarkable in appearance.

Adrenals/Urinary Tract: The adrenal glands are unremarkable in
appearance. The kidneys are within normal limits. There is no
evidence of hydronephrosis. No renal or ureteral stones are
identified. No perinephric stranding is seen.

Stomach/Bowel: The stomach is unremarkable in appearance. The small
bowel is within normal limits. The appendix is normal in caliber,
without evidence of appendicitis. The colon is unremarkable in
appearance.

Vascular/Lymphatic: The abdominal aorta is unremarkable in
appearance. The inferior vena cava is grossly unremarkable. No
retroperitoneal lymphadenopathy is seen. No pelvic sidewall
lymphadenopathy is identified.

Reproductive: The bladder is mildly distended and grossly
unremarkable. The uterus is unremarkable in appearance. The ovaries
are relatively symmetric. No suspicious adnexal masses are seen. A
tampon is noted at the vagina.

Other: No additional soft tissue abnormalities are seen.

Musculoskeletal: No acute osseous abnormalities are identified. The
visualized musculature is unremarkable in appearance.
IMPRESSION: 1. No acute abnormality seen within the abdomen or pelvis.
[DATE] cm hypodensity at the right hepatic lobe likely reflects a
cyst.

## 2019-01-21 ENCOUNTER — Other Ambulatory Visit: Payer: Self-pay | Admitting: Family Medicine

## 2019-01-21 DIAGNOSIS — Z3009 Encounter for other general counseling and advice on contraception: Secondary | ICD-10-CM

## 2019-02-28 DIAGNOSIS — Z20822 Contact with and (suspected) exposure to covid-19: Secondary | ICD-10-CM | POA: Diagnosis not present

## 2019-02-28 DIAGNOSIS — R05 Cough: Secondary | ICD-10-CM | POA: Diagnosis not present

## 2019-05-05 DIAGNOSIS — Z20828 Contact with and (suspected) exposure to other viral communicable diseases: Secondary | ICD-10-CM | POA: Diagnosis not present

## 2019-06-05 DIAGNOSIS — Z03818 Encounter for observation for suspected exposure to other biological agents ruled out: Secondary | ICD-10-CM | POA: Diagnosis not present

## 2019-06-05 DIAGNOSIS — Z20828 Contact with and (suspected) exposure to other viral communicable diseases: Secondary | ICD-10-CM | POA: Diagnosis not present

## 2019-06-16 DIAGNOSIS — Z03818 Encounter for observation for suspected exposure to other biological agents ruled out: Secondary | ICD-10-CM | POA: Diagnosis not present

## 2019-06-16 DIAGNOSIS — Z20828 Contact with and (suspected) exposure to other viral communicable diseases: Secondary | ICD-10-CM | POA: Diagnosis not present

## 2019-07-22 ENCOUNTER — Encounter: Payer: Self-pay | Admitting: Family Medicine

## 2019-07-23 ENCOUNTER — Encounter: Payer: Self-pay | Admitting: Family Medicine

## 2019-07-23 ENCOUNTER — Other Ambulatory Visit: Payer: Self-pay

## 2019-07-23 ENCOUNTER — Telehealth (INDEPENDENT_AMBULATORY_CARE_PROVIDER_SITE_OTHER): Payer: BC Managed Care – PPO | Admitting: Family Medicine

## 2019-07-23 DIAGNOSIS — F4321 Adjustment disorder with depressed mood: Secondary | ICD-10-CM | POA: Diagnosis not present

## 2019-07-23 MED ORDER — LORAZEPAM 0.5 MG PO TABS: 0.5000 mg | ORAL_TABLET | Freq: Three times a day (TID) | ORAL | 0 refills | Status: DC | PRN

## 2019-07-23 NOTE — Progress Notes (Signed)
[place] at [facility] [address] [place], [place] [address] [phone]  Date:  07/23/2019   Name:  Joan Lee   DOB:  12-25-1990   MRN:  [id number]  PCP:  Darreld Mclean, MD    Chief Complaint: No chief complaint on file.   History of Present Illness:  Joan Lee is a 29 y.o. very pleasant female patient who presents with the following:  Virtual visit today-patient is at home, provider's office.  Patient identity confirmed with 2 factors, she gives consent for virtual visit today The patient and myself are present on the visit  Patient with history of migraine headache, here today to discuss anxiety and sadness.  Her father passed away suddenly over the weekend, which has worsened her symptoms quite a bit  Today is 09-Jul-2022, her father died this past 07-11-22- he was in an MVA, his car hit a tree.  He died at the scene.  This was very unexpected and shocking to the family.  She is currently in [place] staying with her mom and siblings The service will be next week-she plans to stay in [place] for a while to be with her mom She is not sure about FMLA- she will let me know if this is needed   No chance of current pregnancy   She is using prozac- she felt like she was doing ok prior to the loss of her dad although she was thinking about changing this medication.  She notes that she is feeling extremely upset and anxious, is having an acute increase of her symptoms due to loss of her father.   No suicidal ideation, she feels that she is safe  Patient Active Problem List   Diagnosis Date Noted  . Common migraine with intractable migraine 04/20/2015    Past Medical History:  Diagnosis Date  . Common migraine with intractable migraine 04/20/2015    Past Surgical History:  Procedure Laterality Date  . WISDOM TOOTH EXTRACTION      Social History   Tobacco Use  . Smoking status: Never Smoker  . Smokeless  tobacco: Never Used  Substance Use Topics  . Alcohol use: No  . Drug use: No    Family History  Problem Relation Age of Onset  . Diabetes Mother   . Migraines Mother   . High blood pressure Mother   . Bell's palsy Father   . Bell's palsy Sister     Allergies  Allergen Reactions  . Tamiflu [Oseltamivir Phosphate] Shortness Of Breath    Medication list has been reviewed and updated.  Current Outpatient Medications on File Prior to Visit  Medication Sig Dispense Refill  . FLUoxetine (PROZAC) 40 MG capsule TAKE 1 CAPSULE BY MOUTH EVERY DAY 30 capsule 11  . levonorgestrel-ethinyl estradiol (ALESSE) 0.1-20 MG-MCG tablet TAKE 1 TABLET DAILY 84 tablet 3  . norgestimate-ethinyl estradiol (SPRINTEC 28) 0.25-35 MG-MCG tablet Take 1 tablet by mouth daily. 1 Package 11   No current facility-administered medications on file prior to visit.    Review of Systems:  As per HPI- otherwise negative.   Physical Examination: There were no vitals filed for this visit. There were no vitals filed for this visit. There is no height or weight on file to calculate BMI. Ideal Body Weight:    .  Patient observed her video monitor.  She looks well, her normal self  Assessment and Plan: Grief reaction - Plan: LORazepam (ATIVAN) 0.5 MG  tablet  Virtual visit today for acute grief.  The patient lost her father unexpectedly in an accident over the weekend.  She is taking fluoxetine 40, but notes that her anxiety, tearfulness are not controlled currently.  I prescribed #30 Ativan for her to use as needed during this acute time.  Advised her that this can be habit-forming, and also sedating.  Use sparingly  She would actually like to potentially change her fluoxetine to something else.  I advised her that we can certainly do this, but I would not do during her acute grief.  She will contact me via MyChart in the next week or 2 we will discuss a change  She will let me know if she is not doing okay, I  expressed our deepest sympathies on the loss of her father Moderate medical decision making today Video used for entirety of visit  Signed Abbe Amsterdam, MD

## 2019-08-17 DIAGNOSIS — F4323 Adjustment disorder with mixed anxiety and depressed mood: Secondary | ICD-10-CM | POA: Diagnosis not present

## 2019-08-29 ENCOUNTER — Encounter: Payer: Self-pay | Admitting: Family Medicine

## 2019-09-01 DIAGNOSIS — F4323 Adjustment disorder with mixed anxiety and depressed mood: Secondary | ICD-10-CM | POA: Diagnosis not present

## 2019-09-01 NOTE — Progress Notes (Signed)
[facility] [address] [place], [place] [address] [phone]  Date:  09/02/2019   Name:  Joan Lee   DOB:  01-03-1991   MRN:  [id number]  PCP:  Pearline Cables, MD    Chief Complaint: No chief complaint on file.   History of Present Illness:  Joan Lee is a 29 y.o. very pleasant female patient who presents with the following:  Patient with history of migraine headache, anxiety and depression exacerbated by reaction Virtual visit today to discuss possible FMLA/short-term disability leave  Patient location is her workplace, provider location is office.  Patient identity confirmed with 2 factors, she gives consent for virtual visit today.  The patient and myself are present on the call today  We last visited on May 27, at that time the patient's father had recently died suddenly in a motor vehicle accident.  This occurred near her parents home in [place], at that time she was staying in [place] for a while to help her mother  At that time I prescribed Ativan to use as needed, had her continue fluoxetine She sent me a MyChart message stating that she returned to work on June 16 but was having a hard time, she was interested in short-term disability or FMLA She notes that she stopped taking her fluoxetine by accident- just forgot- and started having sx of "feeing sick" after being off it for about 2 weeks.   She is taking her prozac again 3 days ago but is having some panic attacks, still suffering from symptoms of depression and anxiety  Underlying first she had such a severe panic attack that she had to leave work She is seeing a grief counselor She notes that her mom and siblings are doing as well as can be expected She does not notice a real difference when she takes lorazepam-does not seem to especially relieve her anxiety symptoms She is sleeping plenty- too much.  She will tend to sleep all day on  her days off, she is feeling really tired and has little energy.  She "just wants to be by myself, I don't want to be bothered"-she does not feel like spending time with her fianc or her dog.  She cannot find much to be excited or happy about She denies any SI or homicidal ideation Going to sleep feels like the only time she is relaxed and peaceful   She has been on prozac for about one year total -at her last visit she expressed that this medication was not working as well as it did previously.  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 Common migraine with intractable migraine 04/20/2015    Past Medical History:  Diagnosis Date  . Common migraine with intractable migraine 04/20/2015    Past Surgical History:  Procedure Laterality Date  . WISDOM TOOTH EXTRACTION      Social History   Tobacco Use  . Smoking status: Never Smoker  . Smokeless tobacco: Never Used  Vaping Use  . Vaping Use: Never used  Substance Use Topics  . Alcohol use: No  . Drug use: No    Family History  Problem Relation Age  of Onset  . Diabetes Mother   . Migraines Mother   . High blood pressure Mother   . Bell's palsy Father   . Bell's palsy Sister     Allergies  Allergen Reactions  . Tamiflu [Oseltamivir Phosphate] Shortness Of Breath    Medication list has been reviewed and updated.  Current Outpatient Medications on File Prior to Visit  Medication Sig Dispense Refill  . FLUoxetine (PROZAC) 40 MG capsule TAKE 1 CAPSULE BY MOUTH EVERY DAY 30 capsule 11  . levonorgestrel-ethinyl estradiol (ALESSE) 0.1-20 MG-MCG tablet TAKE 1 TABLET DAILY 84 tablet 3  . norgestimate-ethinyl estradiol (SPRINTEC 28) 0.25-35 MG-MCG tablet Take 1  tablet by mouth daily. 1 Package 11   No current facility-administered medications on file prior to visit.    Review of Systems:  As per HPI- otherwise negative.   Physical Examination: There were no vitals filed for this visit. There were no vitals filed for this visit. There is no height or weight on file to calculate BMI. Ideal Body Weight:    Pt observed over video monitor-she looks physically well, her normal self She is not checking vital signs at home  Assessment and Plan: Adjustment disorder with mixed anxiety and depressed mood - Plan: venlafaxine XR (EFFEXOR XR) 37.5 MG 24 hr capsule  Grief reaction - Plan: LORazepam (ATIVAN) 0.5 MG tablet  Panic attack   Video visit today to discuss problems with grief, depression and anxiety.  The patient has suffered some from depression /anxiety in the past, but was doing relatively well until her father died suddenly on 16-Aug-2022 She went back to work about 3 weeks ago, but notes that she does not feel able to work at this time She is not able to properly help her customers as she has difficulty concentrating and may become tearful.  She also is suffering from panic attacks which caused her to have to leave work on occasion. Physically, she feels exhausted and admits to sleeping all day when she is able  She had been on Prozac for about 2 weeks, started back on it 3 days ago.  I asked her to skip today's dose, then take tomorrow for a brief taper.  Following that she can stop Prozac and start her on Effexor XR 37.5.  Continue this for 1 week, then can increase to 75  I also refilled her lorazepam  I encouraged her to keep to a schedule of some sort even while she is not working, and plan activities that she enjoys.  I encouraged her to spend time outdoors and exercise.  I asked her to check back with me in 2 weeks via MyChart, sooner if not doing okay  Right now we will have her on leave from July 10 through August 9, she may  certainly go back to work sooner if she feels able She is asked to seek immediate care if she should experience any suicidal ideation or other problems  I have completed her leave paperwork for [facility], will give this to my assistant to be scanned and faxed  Signed Abbe Amsterdam, MD

## 2019-09-02 ENCOUNTER — Other Ambulatory Visit: Payer: Self-pay

## 2019-09-02 ENCOUNTER — Telehealth (INDEPENDENT_AMBULATORY_CARE_PROVIDER_SITE_OTHER): Payer: BC Managed Care – PPO | Admitting: Family Medicine

## 2019-09-02 ENCOUNTER — Encounter: Payer: Self-pay | Admitting: Family Medicine

## 2019-09-02 DIAGNOSIS — F4321 Adjustment disorder with depressed mood: Secondary | ICD-10-CM

## 2019-09-02 DIAGNOSIS — F4323 Adjustment disorder with mixed anxiety and depressed mood: Secondary | ICD-10-CM | POA: Diagnosis not present

## 2019-09-02 DIAGNOSIS — F410 Panic disorder [episodic paroxysmal anxiety] without agoraphobia: Secondary | ICD-10-CM | POA: Diagnosis not present

## 2019-09-02 MED ORDER — VENLAFAXINE HCL ER 37.5 MG PO CP24: 37.5000 mg | ORAL_CAPSULE | Freq: Every day | ORAL | 6 refills | Status: DC

## 2019-09-02 MED ORDER — LORAZEPAM 0.5 MG PO TABS: 0.5000 mg | ORAL_TABLET | Freq: Three times a day (TID) | ORAL | 0 refills | Status: DC | PRN

## 2019-09-03 ENCOUNTER — Telehealth: Payer: Self-pay

## 2019-09-03 NOTE — Telephone Encounter (Signed)
Received prior authorization request for effexor 37.5 mg BID. Attempted PA. KEY: [id number]  Waiting on reply approval/denial.

## 2019-09-07 DIAGNOSIS — F4323 Adjustment disorder with mixed anxiety and depressed mood: Secondary | ICD-10-CM | POA: Diagnosis not present

## 2019-09-09 ENCOUNTER — Encounter: Payer: Self-pay | Admitting: Family Medicine

## 2019-09-09 NOTE — Telephone Encounter (Signed)
Medication approved through insurance. Approval from 08/04/2019-09/03/2022

## 2019-09-15 DIAGNOSIS — F4323 Adjustment disorder with mixed anxiety and depressed mood: Secondary | ICD-10-CM | POA: Diagnosis not present

## 2019-09-29 DIAGNOSIS — F4323 Adjustment disorder with mixed anxiety and depressed mood: Secondary | ICD-10-CM | POA: Diagnosis not present

## 2019-10-22 DIAGNOSIS — F4323 Adjustment disorder with mixed anxiety and depressed mood: Secondary | ICD-10-CM | POA: Diagnosis not present

## 2019-10-27 DIAGNOSIS — F411 Generalized anxiety disorder: Secondary | ICD-10-CM | POA: Diagnosis not present

## 2019-12-19 NOTE — Patient Instructions (Signed)
It was very nice to see you again today, I will be in touch with your labs soon as possible We also did your pap today Flu shot today  We will increase your effexor to 150 mg a day for depression- please let me know how this works for you    Health Maintenance, Female Adopting a healthy lifestyle and getting preventive care are important in promoting health and wellness. Ask your health care provider about:  The right schedule for you to have regular tests and exams.  Things you can do on your own to prevent diseases and keep yourself healthy. What should I know about diet, weight, and exercise? Eat a healthy diet   Eat a diet that includes plenty of vegetables, fruits, low-fat dairy products, and lean protein.  Do not eat a lot of foods that are high in solid fats, added sugars, or sodium. Maintain a healthy weight Body mass index (BMI) is used to identify weight problems. It estimates body fat based on height and weight. Your health care provider can help determine your BMI and help you achieve or maintain a healthy weight. Get regular exercise Get regular exercise. This is one of the most important things you can do for your health. Most adults should:  Exercise for at least 150 minutes each week. The exercise should increase your heart rate and make you sweat (moderate-intensity exercise).  Do strengthening exercises at least twice a week. This is in addition to the moderate-intensity exercise.  Spend less time sitting. Even light physical activity can be beneficial. Watch cholesterol and blood lipids Have your blood tested for lipids and cholesterol at 27 years of age, then have this test every 5 years. Have your cholesterol levels checked more often if:  Your lipid or cholesterol levels are high.  You are older than 27 years of age.  You are at high risk for heart disease. What should I know about cancer screening? Depending on your health history and family history,  you may need to have cancer screening at various ages. This may include screening for:  Breast cancer.  Cervical cancer.  Colorectal cancer.  Skin cancer.  Lung cancer. What should I know about heart disease, diabetes, and high blood pressure? Blood pressure and heart disease  High blood pressure causes heart disease and increases the risk of stroke. This is more likely to develop in people who have high blood pressure readings, are of African descent, or are overweight.  Have your blood pressure checked: ? Every 3-5 years if you are 4-22 years of age. ? Every year if you are 68 years old or older. Diabetes Have regular diabetes screenings. This checks your fasting blood sugar level. Have the screening done:  Once every three years after age 81 if you are at a normal weight and have a low risk for diabetes.  More often and at a younger age if you are overweight or have a high risk for diabetes. What should I know about preventing infection? Hepatitis B If you have a higher risk for hepatitis B, you should be screened for this virus. Talk with your health care provider to find out if you are at risk for hepatitis B infection. Hepatitis C Testing is recommended for:  Everyone born from [birth year] through 1963.  Anyone with known risk factors for hepatitis C. Sexually transmitted infections (STIs)  Get screened for STIs, including gonorrhea and chlamydia, if: ? You are sexually active and are younger than 27 years of  age. ? You are older than 27 years of age and your health care provider tells you that you are at risk for this type of infection. ? Your sexual activity has changed since you were last screened, and you are at increased risk for chlamydia or gonorrhea. Ask your health care provider if you are at risk.  Ask your health care provider about whether you are at high risk for HIV. Your health care provider may recommend a prescription medicine to help prevent HIV infection.  If you choose to take medicine to prevent HIV, you should first get tested for HIV. You should then be tested every 3 months for as long as you are taking the medicine. Pregnancy  If you are about to stop having your period (premenopausal) and you may become pregnant, seek counseling before you get pregnant.  Take 400 to 800 micrograms (mcg) of folic acid every day if you become pregnant.  Ask for birth control (contraception) if you want to prevent pregnancy. Osteoporosis and menopause Osteoporosis is a disease in which the bones lose minerals and strength with aging. This can result in bone fractures. If you are 31 years old or older, or if you are at risk for osteoporosis and fractures, ask your health care provider if you should:  Be screened for bone loss.  Take a calcium or vitamin D supplement to lower your risk of fractures.  Be given hormone replacement therapy (HRT) to treat symptoms of menopause. Follow these instructions at home: Lifestyle  Do not use any products that contain nicotine or tobacco, such as cigarettes, e-cigarettes, and chewing tobacco. If you need help quitting, ask your health care provider.  Do not use street drugs.  Do not share needles.  Ask your health care provider for help if you need support or information about quitting drugs. Alcohol use  Do not drink alcohol if: ? Your health care provider tells you not to drink. ? You are pregnant, may be pregnant, or are planning to become pregnant.  If you drink alcohol: ? Limit how much you use to 0-1 drink a day. ? Limit intake if you are breastfeeding.  Be aware of how much alcohol is in your drink. In the [place] one drink equals one 12 oz bottle of beer (355 mL), one 5 oz glass of wine (148 mL), or one 1 oz glass of hard liquor (44 mL). General instructions  Schedule regular health, dental, and eye exams.  Stay current with your vaccines.  Tell your health care provider if: ? You often feel  depressed. ? You have ever been abused or do not feel safe at home. Summary  Adopting a healthy lifestyle and getting preventive care are important in promoting health and wellness.  Follow your health care provider's instructions about healthy diet, exercising, and getting tested or screened for diseases.  Follow your health care provider's instructions on monitoring your cholesterol and blood pressure. This information is not intended to replace advice given to you by your health care provider. Make sure you discuss any questions you have with your health care provider. Document Revised: 02/05/2018 Document Reviewed: 02/05/2018 Elsevier Patient Education  2020 [employer].

## 2019-12-19 NOTE — Progress Notes (Addendum)
[facility] [address], [address] [place], [place] [address] [phone]  Date:  12/21/2019   Name:  Joan Lee   DOB:  12-26-1990   MRN:  [id number]  PCP:  Pearline Cables, MD    Chief Complaint: Annual Exam   History of Present Illness:  Joan Lee is a 29 y.o. very pleasant female patient who presents with the following:  Patient with history of migraine headache and depression, here today for physical exam  Last seen by myself virtually over the summer; her father died suddenly in a motor vehicle accident at the end of May.  This of course was very shocking and sad for the patient, she has been suffering from grief reaction and anxiety.  At her last visit in July she was still struggling, she took a leave from work and I started her on Effexor, continue lorazepam as needed  Her work leave was meant to run through mid August- she went back to work in [employer] September.  She is still struggling some and may be able to work from home She is on 2 of effexor xr right now- she would like to go up on her dose She has not needed her ativan in some time She is sleeping fine. No SI   Hep C screening COVID-19 vaccine- done  Pap smear- we will do today LMP was about one month ago Flu shot- give today  Most recent labs in 2020  Patient Active Problem List   Diagnosis Date Noted  . Common migraine with intractable migraine 04/20/2015    Past Medical History:  Diagnosis Date  . Common migraine with intractable migraine 04/20/2015    Past Surgical History:  Procedure Laterality Date  . WISDOM TOOTH EXTRACTION      Social History   Tobacco Use  . Smoking status: Never Smoker  . Smokeless tobacco: Never Used  Vaping Use  . Vaping Use: Never used  Substance Use Topics  . Alcohol use: No  . Drug use: No    Family History  Problem Relation Age of Onset  . Diabetes Mother   . Migraines Mother   . High blood  pressure Mother   . Bell's palsy Father   . Bell's palsy Sister     Allergies  Allergen Reactions  . Tamiflu [Oseltamivir Phosphate] Shortness Of Breath    Medication list has been reviewed and updated.  Current Outpatient Medications on File Prior to Visit  Medication Sig Dispense Refill  . LORazepam (ATIVAN) 0.5 MG tablet Take 1 tablet (0.5 mg total) by mouth every 8 (eight) hours as needed for anxiety. 30 tablet 0  . norgestimate-ethinyl estradiol (SPRINTEC 28) 0.25-35 MG-MCG tablet Take 1 tablet by mouth daily. 1 Package 11   No current facility-administered medications on file prior to visit.    Review of Systems:  As per HPI- otherwise negative.   Physical Examination: Vitals:   12/21/19 1027  BP: (!) 128/92  Pulse: 83  Resp: 18  SpO2: 97%   Vitals:   12/21/19 1027  Weight: (!) 305 lb (138.3 kg)  Height: 5\' 8"  (1.727 m)   Body mass index is 46.38 kg/m. Ideal Body Weight: Weight in (lb) to have BMI = 25: 164.1  GEN: no acute distress.  Obese, ow looks well  HEENT: Atraumatic, Normocephalic.  Ears and Nose: No external deformity. CV: RRR, No M/G/R. No JVD. No thrill. No extra heart sounds. PULM: CTA  B, no wheezes, crackles, rhonchi. No retractions. No resp. distress. No accessory muscle use. ABD: S, NT, ND, +BS. No rebound. No HSM. EXTR: No c/c/e PSYCH: Normally interactive. Conversant.  Pap collected today -normal pelvic exam  Wt Readings from Last 3 Encounters:  12/21/19 (!) 305 lb (138.3 kg)  10/23/18 (!) 302 lb (137 kg)  06/26/18 294 lb (133.4 kg)     Assessment and Plan: Physical exam  Screening for hyperlipidemia - Plan: Lipid panel  Screening for deficiency anemia - Plan: CBC  Screening for diabetes mellitus - Plan: Comprehensive metabolic panel, Hemoglobin A1c  Screening for thyroid disorder - Plan: TSH  Encounter for vitamin deficiency screening - Plan: VITAMIN D 25 Hydroxy (Vit-D Deficiency, Fractures)  Encounter for hepatitis C  screening test for low risk patient - Plan: Hepatitis C antibody  Needs flu shot - Plan: Flu Vaccine QUAD 6+ mos PF IM (Fluarix Quad PF)  Adjustment disorder with mixed anxiety and depressed mood - Plan: venlafaxine XR (EFFEXOR XR) 150 MG 24 hr capsule  Cervical cancer screening - Plan: Cytology - PAP  Routine screening for STI (sexually transmitted infection) - Plan: Cervicovaginal ancillary only( [place])  Patient today with physical exam.  I will be in touch with her labs as soon as possible Discussed health maintenance, encouraged healthy diet and exercise routine Increased her dose of Effexor XR 150 mg daily Pap is pending, she would like to do gonorrhea and Chlamydia screening today Flu shot given  This visit occurred during the SARS-CoV-2 public health emergency.  Safety protocols were in place, including screening questions prior to the visit, additional usage of staff PPE, and extensive cleaning of exam room while observing appropriate contact time as indicated for disinfecting solutions.    Signed Abbe Amsterdam, MD  Addendum 10/26, received labs as below Message to patient  Results for orders placed or performed in visit on 12/21/19  CBC  Result Value Ref Range   WBC 7.7 3.8 - 10.8 Thousand/uL   RBC 4.37 3.80 - 5.10 Million/uL   Hemoglobin 13.4 11.7 - 15.5 g/dL   HCT 11.6 35 - 45 %   MCV 89.7 80.0 - 100.0 fL   MCH 30.7 27.0 - 33.0 pg   MCHC 34.2 32.0 - 36.0 g/dL   RDW 57.9 03.8 - 33.3 %   Platelets 332 140 - 400 Thousand/uL   MPV 10.0 7.5 - 12.5 fL  Comprehensive metabolic panel  Result Value Ref Range   Glucose, Bld 76 65 - 99 mg/dL   BUN 9 7 - 25 mg/dL   Creat 8.32 9.19 - 1.66 mg/dL   BUN/Creatinine Ratio NOT APPLICABLE 6 - 22 (calc)   Sodium 138 135 - 146 mmol/L   Potassium 4.6 3.5 - 5.3 mmol/L   Chloride 104 98 - 110 mmol/L   CO2 23 20 - 32 mmol/L   Calcium 9.6 8.6 - 10.2 mg/dL   Total Protein 7.4 6.1 - 8.1 g/dL   Albumin 4.1 3.6 - 5.1 g/dL    Globulin 3.3 1.9 - 3.7 g/dL (calc)   AG Ratio 1.2 1.0 - 2.5 (calc)   Total Bilirubin 0.4 0.2 - 1.2 mg/dL   Alkaline phosphatase (APISO) 64 31 - 125 U/L   AST 13 10 - 30 U/L   ALT 8 6 - 29 U/L  Hemoglobin A1c  Result Value Ref Range   Hgb A1c MFr Bld 5.2 <5.7 % of total Hgb   Mean Plasma Glucose 103 (calc)   eAG (mmol/L) 5.7 (  calc)  Hepatitis C antibody  Result Value Ref Range   Hepatitis C Ab NON-REACTIVE NON-REACTI   SIGNAL TO CUT-OFF 0.82 <1.00  Lipid panel  Result Value Ref Range   Cholesterol 207 (H) <200 mg/dL   HDL 51 > OR = 50 mg/dL   Triglycerides 91 <193 mg/dL   LDL Cholesterol (Calc) 137 (H) mg/dL (calc)   Total CHOL/HDL Ratio 4.1 <5.0 (calc)   Non-HDL Cholesterol (Calc) 156 (H) <130 mg/dL (calc)  TSH  Result Value Ref Range   TSH 1.60 mIU/L  VITAMIN D 25 Hydroxy (Vit-D Deficiency, Fractures)  Result Value Ref Range   Vit D, 25-Hydroxy 8 (L) 30 - 100 ng/mL

## 2019-12-21 ENCOUNTER — Other Ambulatory Visit: Payer: Self-pay | Admitting: *Deleted

## 2019-12-21 ENCOUNTER — Encounter: Payer: Self-pay | Admitting: Family Medicine

## 2019-12-21 ENCOUNTER — Other Ambulatory Visit (HOSPITAL_COMMUNITY)
Admission: RE | Admit: 2019-12-21 | Discharge: 2019-12-21 | Disposition: A | Discharge status: Home / Self Care | Payer: BC Managed Care – PPO | Source: Ambulatory Visit | Attending: Family Medicine | Admitting: Family Medicine

## 2019-12-21 ENCOUNTER — Other Ambulatory Visit: Payer: Self-pay

## 2019-12-21 ENCOUNTER — Ambulatory Visit (INDEPENDENT_AMBULATORY_CARE_PROVIDER_SITE_OTHER): Payer: BC Managed Care – PPO | Admitting: Family Medicine

## 2019-12-21 VITALS — BP 120/80 | HR 83 | Resp 18 | Ht 68.0 in | Wt 305.0 lb

## 2019-12-21 DIAGNOSIS — Z124 Encounter for screening for malignant neoplasm of cervix: Secondary | ICD-10-CM

## 2019-12-21 DIAGNOSIS — Z130 Encounter for screening for diseases of the blood and blood-forming organs and certain disorders involving the immune mechanism: Secondary | ICD-10-CM

## 2019-12-21 DIAGNOSIS — Z113 Encounter for screening for infections with a predominantly sexual mode of transmission: Secondary | ICD-10-CM | POA: Insufficient documentation

## 2019-12-21 DIAGNOSIS — F4323 Adjustment disorder with mixed anxiety and depressed mood: Secondary | ICD-10-CM

## 2019-12-21 DIAGNOSIS — E559 Vitamin D deficiency, unspecified: Secondary | ICD-10-CM

## 2019-12-21 DIAGNOSIS — Z1159 Encounter for screening for other viral diseases: Secondary | ICD-10-CM | POA: Diagnosis not present

## 2019-12-21 DIAGNOSIS — Z0000 Encounter for general adult medical examination without abnormal findings: Secondary | ICD-10-CM

## 2019-12-21 DIAGNOSIS — Z13220 Encounter for screening for lipoid disorders: Secondary | ICD-10-CM

## 2019-12-21 DIAGNOSIS — Z131 Encounter for screening for diabetes mellitus: Secondary | ICD-10-CM | POA: Diagnosis not present

## 2019-12-21 DIAGNOSIS — Z1321 Encounter for screening for nutritional disorder: Secondary | ICD-10-CM

## 2019-12-21 DIAGNOSIS — Z23 Encounter for immunization: Secondary | ICD-10-CM

## 2019-12-21 DIAGNOSIS — Z1329 Encounter for screening for other suspected endocrine disorder: Secondary | ICD-10-CM

## 2019-12-21 MED ORDER — VENLAFAXINE HCL ER 150 MG PO CP24: 150.0000 mg | ORAL_CAPSULE | Freq: Every day | ORAL | 3 refills | Status: DC

## 2019-12-22 ENCOUNTER — Encounter: Payer: Self-pay | Admitting: Family Medicine

## 2019-12-22 DIAGNOSIS — E559 Vitamin D deficiency, unspecified: Secondary | ICD-10-CM | POA: Insufficient documentation

## 2019-12-22 LAB — CYTOLOGY - PAP
Comment: NEGATIVE
Diagnosis: NEGATIVE
High risk HPV: NEGATIVE

## 2019-12-22 LAB — LIPID PANEL
Cholesterol: 207 mg/dL — ABNORMAL HIGH (ref <200)
HDL: 51 mg/dL (ref >=50)
LDL Cholesterol (Calc): 137 mg/dL (calc) — ABNORMAL HIGH
Non-HDL Cholesterol (Calc): 156 mg/dL (calc) — ABNORMAL HIGH (ref <130)
Total CHOL/HDL Ratio: 4.1 (calc) (ref <5.0)
Triglycerides: 91 mg/dL (ref <150)

## 2019-12-22 LAB — COMPREHENSIVE METABOLIC PANEL
AG Ratio: 1.2 (calc) (ref 1.0–2.5)
ALT: 8 U/L (ref 6–29)
AST: 13 U/L (ref 10–30)
Albumin: 4.1 g/dL (ref 3.6–5.1)
Alkaline phosphatase (APISO): 64 U/L (ref 31–125)
BUN: 9 mg/dL (ref 7–25)
CO2: 23 mmol/L (ref 20–32)
Calcium: 9.6 mg/dL (ref 8.6–10.2)
Chloride: 104 mmol/L (ref 98–110)
Creat: 0.8 mg/dL (ref 0.50–1.10)
Globulin: 3.3 g/dL (calc) (ref 1.9–3.7)
Glucose, Bld: 76 mg/dL (ref 65–99)
Potassium: 4.6 mmol/L (ref 3.5–5.3)
Sodium: 138 mmol/L (ref 135–146)
Total Bilirubin: 0.4 mg/dL (ref 0.2–1.2)
Total Protein: 7.4 g/dL (ref 6.1–8.1)

## 2019-12-22 LAB — CBC
HCT: 39.2 % (ref 35.0–45.0)
Hemoglobin: 13.4 g/dL (ref 11.7–15.5)
MCH: 30.7 pg (ref 27.0–33.0)
MCHC: 34.2 g/dL (ref 32.0–36.0)
MCV: 89.7 fL (ref 80.0–100.0)
MPV: 10 fL (ref 7.5–12.5)
Platelets: 332 10*3/uL (ref 140–400)
RBC: 4.37 10*6/uL (ref 3.80–5.10)
RDW: 12.4 % (ref 11.0–15.0)
WBC: 7.7 10*3/uL (ref 3.8–10.8)

## 2019-12-22 LAB — CERVICOVAGINAL ANCILLARY ONLY
Chlamydia: NEGATIVE
Comment: NEGATIVE
Comment: NORMAL
Neisseria Gonorrhea: NEGATIVE

## 2019-12-22 LAB — HEMOGLOBIN A1C
Hgb A1c MFr Bld: 5.2 % of total Hgb (ref <5.7)
Mean Plasma Glucose: 103 (calc)
eAG (mmol/L): 5.7 (calc)

## 2019-12-22 LAB — TSH: TSH: 1.6 mIU/L

## 2019-12-22 LAB — HEPATITIS C ANTIBODY
Hepatitis C Ab: NONREACTIVE
SIGNAL TO CUT-OFF: 0.82 (ref <1.00)

## 2019-12-22 LAB — VITAMIN D 25 HYDROXY (VIT D DEFICIENCY, FRACTURES): Vit D, 25-Hydroxy: 8 ng/mL — ABNORMAL LOW (ref 30–100)

## 2019-12-22 MED ORDER — VITAMIN D3 1.25 MG (50000 UT) PO CAPS: ORAL_CAPSULE | ORAL | 0 refills | Status: DC

## 2019-12-22 NOTE — Addendum Note (Signed)
Addended by: Abbe Amsterdam C on: 12/22/2019 06:20 AM   Modules accepted: Orders

## 2019-12-23 ENCOUNTER — Encounter: Payer: Self-pay | Admitting: Family Medicine

## 2019-12-23 MED ORDER — NORGESTIMATE-ETH ESTRADIOL 0.25-35 MG-MCG PO TABS: 1.0000 | ORAL_TABLET | Freq: Every day | ORAL | 12 refills | Status: DC

## 2019-12-25 DIAGNOSIS — F411 Generalized anxiety disorder: Secondary | ICD-10-CM | POA: Diagnosis not present

## 2020-01-16 ENCOUNTER — Encounter: Payer: Self-pay | Admitting: Family Medicine

## 2020-01-17 DIAGNOSIS — N915 Oligomenorrhea, unspecified: Secondary | ICD-10-CM | POA: Diagnosis not present

## 2020-01-17 DIAGNOSIS — N390 Urinary tract infection, site not specified: Secondary | ICD-10-CM | POA: Diagnosis not present

## 2020-01-17 DIAGNOSIS — R319 Hematuria, unspecified: Secondary | ICD-10-CM | POA: Diagnosis not present

## 2020-01-20 ENCOUNTER — Encounter: Payer: Self-pay | Admitting: Family Medicine

## 2020-01-20 ENCOUNTER — Telehealth: Payer: Self-pay | Admitting: Family Medicine

## 2020-01-20 NOTE — Telephone Encounter (Signed)
[employer] forms faxed into front office  Put into [place] bin up front

## 2020-01-20 NOTE — Telephone Encounter (Signed)
Received forms. Filled out portion I could. Placed in providers folder for review and signature.

## 2020-01-25 ENCOUNTER — Other Ambulatory Visit: Payer: Self-pay

## 2020-01-25 ENCOUNTER — Emergency Department (HOSPITAL_COMMUNITY)
Admission: EM | Admit: 2020-01-25 | Discharge: 2020-01-25 | Discharge status: Against Medical Advice | Payer: BC Managed Care – PPO

## 2020-01-25 NOTE — ED Notes (Signed)
Pt stated that her pain "has passed" and she didn't need to be seen Pt left triage Pt ambulatory out if triage RN [facility] informed

## 2020-01-26 NOTE — Telephone Encounter (Signed)
Forms signed and faxed to [employer]. Patient made aware. Sent original to scan. Copy on file on my desk until in media in patients EHR.

## 2020-02-01 NOTE — Telephone Encounter (Signed)
Sent patient mychart message

## 2020-02-01 NOTE — Telephone Encounter (Signed)
Patient called in reference to the original copy of [facility] forms and would like know if we still had them.   Please advise

## 2020-02-23 DIAGNOSIS — Z20822 Contact with and (suspected) exposure to covid-19: Secondary | ICD-10-CM | POA: Diagnosis not present

## 2020-03-07 ENCOUNTER — Telehealth: Payer: Self-pay | Admitting: Family Medicine

## 2020-03-07 NOTE — Telephone Encounter (Signed)
[employer] papers faxed into front office for [place] to fill out

## 2020-03-07 NOTE — Telephone Encounter (Signed)
error 

## 2020-03-08 NOTE — Telephone Encounter (Signed)
Received paperwork. Placed in providers folder for review.

## 2020-03-08 NOTE — Telephone Encounter (Signed)
Per patient discard copy. Patient is leaving company.

## 2020-03-22 ENCOUNTER — Telehealth: Payer: BC Managed Care – PPO | Admitting: Family

## 2020-03-22 DIAGNOSIS — R399 Unspecified symptoms and signs involving the genitourinary system: Secondary | ICD-10-CM | POA: Diagnosis not present

## 2020-03-22 MED ORDER — CEPHALEXIN 500 MG PO CAPS: 500.0000 mg | ORAL_CAPSULE | Freq: Two times a day (BID) | ORAL | 0 refills | Status: DC

## 2020-03-22 NOTE — Progress Notes (Signed)

## 2020-04-22 ENCOUNTER — Other Ambulatory Visit: Payer: Self-pay

## 2020-04-22 MED ORDER — NORGESTIMATE-ETH ESTRADIOL 0.25-35 MG-MCG PO TABS: 1.0000 | ORAL_TABLET | Freq: Every day | ORAL | 2 refills | Status: DC

## 2020-06-06 ENCOUNTER — Encounter: Payer: Self-pay | Admitting: Obstetrics & Gynecology

## 2020-06-06 ENCOUNTER — Other Ambulatory Visit: Payer: Self-pay

## 2020-06-06 ENCOUNTER — Ambulatory Visit (INDEPENDENT_AMBULATORY_CARE_PROVIDER_SITE_OTHER): Payer: No Typology Code available for payment source | Admitting: Obstetrics & Gynecology

## 2020-06-06 VITALS — BP 132/87 | HR 75 | Ht 68.0 in | Wt 298.0 lb

## 2020-06-06 DIAGNOSIS — Z8249 Family history of ischemic heart disease and other diseases of the circulatory system: Secondary | ICD-10-CM

## 2020-06-06 DIAGNOSIS — Z3009 Encounter for other general counseling and advice on contraception: Secondary | ICD-10-CM

## 2020-06-06 DIAGNOSIS — R030 Elevated blood-pressure reading, without diagnosis of hypertension: Secondary | ICD-10-CM

## 2020-06-06 MED ORDER — NORGESTIMATE-ETH ESTRADIOL 0.25-35 MG-MCG PO TABS: 1.0000 | ORAL_TABLET | Freq: Every day | ORAL | 12 refills | Status: AC

## 2020-06-06 NOTE — Patient Instructions (Signed)
Preventing Hypertension Hypertension, also called high blood pressure, is when the force of blood pumping through the arteries is too strong. Arteries are blood vessels that carry blood from the heart throughout the body. Often, hypertension does not cause symptoms until blood pressure is very high. It is important to have your blood pressure checked regularly. Diet and lifestyle changes can help you prevent hypertension, and they may make you feel better overall and improve your quality of life. If you already have hypertension, you may control it with diet and lifestyle changes, as well as with medicine. How can this condition affect me? Over time, hypertension can damage the arteries and decrease blood flow to important parts of the body, including the brain, heart, and kidneys. By keeping your blood pressure in a healthy range, you can help prevent complications like heart attack, heart failure, stroke, kidney failure, and vascular dementia. What can increase my risk?  Being an older adult. Older people are more often affected.  Having family members who have had high blood pressure.  Being obese.  Being female. Males are more likely to have high blood pressure.  Drinking too much alcohol or caffeine.  Smoking or using illegal drugs.  Taking certain medicines, such as antidepressants, decongestants, birth control pills, and NSAIDs, such as ibuprofen.  Having thyroid problems.  Having certain tumors. What actions can I take to prevent or manage this condition? Work with your health care provider to make a hypertension prevention plan that works for you. Follow your plan and keep all follow-up visits as told by your health care provider. Diet changes Maintain a healthy diet. This includes:  Eating less salt (sodium). Ask your health care provider how much sodium is safe for you to have. The general recommendation is to have less than 1 tsp (2,300 mg) of sodium a day. ? Do not add salt  to your food. ? Choose low-sodium options when grocery shopping and eating out.  Limiting fats in your diet. You can do this by eating low-fat or fat-free dairy products and by eating less red meat.  Eating more fruits, vegetables, and whole grains. Make a goal to eat: ? 1-2 cups of fresh fruits and vegetables each day. ? 3-4 servings of whole grains each day.  Avoiding foods and beverages that have added sugars.  Eating fish that contain healthy fats (omega-3 fatty acids), such as mackerel or salmon. If you need help putting together a healthy eating plan, try the DASH diet. This diet is high in fruits, vegetables, and whole grains. It is low in sodium, red meat, and added sugars. DASH stands for Dietary Approaches to Stop Hypertension.   Lifestyle changes Lose weight if you are overweight. Losing just 3?5% of your body weight can help prevent or control hypertension. For example, if your present weight is 200 lb (91 kg), a loss of 3-5% of your weight means losing 6-10 lb (2.7-4.5 kg). Ask your health care provider to help you with a diet and exercise plan to safely lose weight. Other recommendations usually include:  Get enough exercise. Do at least 150 minutes of moderate-intensity exercise each week. You could do this in short exercise sessions several times a day, or you could do longer exercise sessions a few times a week. For example, you could take a brisk 10-minute walk or bike ride, 3 times a day, for 5 days a week.  Find ways to reduce stress, such as exercising, meditating, listening to music, or taking a yoga class.  If you need help reducing stress, ask your health care provider.  Do not use any products that contain nicotine or tobacco, such as cigarettes, e-cigarettes, and chewing tobacco. If you need help quitting, ask your health care provider. Chemicals in tobacco and nicotine products raise your blood pressure each time you use them. If you need help quitting, ask your health  care provider.  Learn how to check your blood pressure at home. Make sure that you know your personal target blood pressure, as told by your health care provider.  Try to sleep 7-9 hours per night.   Alcohol use  Do not drink alcohol if: ? Your health care provider tells you not to drink. ? You are pregnant, may be pregnant, or are planning to become pregnant.  If you drink alcohol: ? Limit how much you use to:  0-1 drink a day for women.  0-2 drinks a day for men. ? Be aware of how much alcohol is in your drink. In the [place] one drink equals one 12 oz bottle of beer (355 mL), one 5 oz glass of wine (148 mL), or one 1 oz glass of hard liquor (44 mL). Medicines In addition to diet and lifestyle changes, your health care provider may recommend medicines to help lower your blood pressure. In general:  You may need to try a few different medicines to find what works best for you.  You may need to take more than one medicine.  Take over-the-counter and prescription medicines only as told by your health care provider. Questions to ask your health care provider  What is my blood pressure goal?  How can I lower my risk for high blood pressure?  How should I monitor my blood pressure at home? Where to find support Your health care provider can help you prevent hypertension and help you keep your blood pressure at a healthy level. Your local hospital or your community may also provide support services and prevention programs. The American Heart Association offers an online support network at supportnetwork.heart.org Where to find more information Learn more about hypertension from:  National Heart, Lung, and Blood Institute: www.nhlbi.nih.gov  Centers for Disease Control and Prevention: www.cdc.gov  American Academy of Family Physicians: familydoctor.org Learn more about the DASH diet from:  National Heart, Lung, and Blood Institute: www.nhlbi.nih.gov Contact a health care  provider if:  You think you are having a reaction to medicines you have taken.  You have recurrent headaches or feel dizzy.  You have swelling in your ankles.  You have trouble with your vision. Get help right away if:  You have sudden, severe chest, back, or abdominal pain or discomfort.  You have shortness of breath.  You have a sudden, severe headache. These symptoms may represent a serious problem that is an emergency. Do not wait to see if the symptoms will go away. Get medical help right away. Call your local emergency services (911 in the [place] Do not drive yourself to the hospital.  Summary  Hypertension often does not cause any symptoms until blood pressure is very high. It is important to get your blood pressure checked regularly.  Diet and lifestyle changes are important steps in preventing hypertension.  By keeping your blood pressure in a healthy range, you may prevent complications like heart attack, heart failure, stroke, and kidney failure.  Work with your health care provider to make a hypertension prevention plan that works for you. This information is not intended to replace advice given to   you by your health care provider. Make sure you discuss any questions you have with your health care provider. Document Revised: 01/13/2019 Document Reviewed: 01/13/2019 Elsevier Patient Education  2021 Elsevier Inc. Hypertension, Adult High blood pressure (hypertension) is when the force of blood pumping through the arteries is too strong. The arteries are the blood vessels that carry blood from the heart throughout the body. Hypertension forces the heart to work harder to pump blood and may cause arteries to become narrow or stiff. Untreated or uncontrolled hypertension can cause a heart attack, heart failure, a stroke, kidney disease, and other problems. A blood pressure reading consists of a higher number over a lower number. Ideally, your blood pressure should be below  120/80. The first ("top") number is called the systolic pressure. It is a measure of the pressure in your arteries as your heart beats. The second ("bottom") number is called the diastolic pressure. It is a measure of the pressure in your arteries as the heart relaxes. What are the causes? The exact cause of this condition is not known. There are some conditions that result in or are related to high blood pressure. What increases the risk? Some risk factors for high blood pressure are under your control. The following factors may make you more likely to develop this condition:  Smoking.  Having type 2 diabetes mellitus, high cholesterol, or both.  Not getting enough exercise or physical activity.  Being overweight.  Having too much fat, sugar, calories, or salt (sodium) in your diet.  Drinking too much alcohol. Some risk factors for high blood pressure may be difficult or impossible to change. Some of these factors include:  Having chronic kidney disease.  Having a family history of high blood pressure.  Age. Risk increases with age.  Race. You may be at higher risk if you are African American.  Gender. Men are at higher risk than women before age 75. After age 87, women are at higher risk than men.  Having obstructive sleep apnea.  Stress. What are the signs or symptoms? High blood pressure may not cause symptoms. Very high blood pressure (hypertensive crisis) may cause:  Headache.  Anxiety.  Shortness of breath.  Nosebleed.  Nausea and vomiting.  Vision changes.  Severe chest pain.  Seizures. How is this diagnosed? This condition is diagnosed by measuring your blood pressure while you are seated, with your arm resting on a flat surface, your legs uncrossed, and your feet flat on the floor. The cuff of the blood pressure monitor will be placed directly against the skin of your upper arm at the level of your heart. It should be measured at least twice using the same  arm. Certain conditions can cause a difference in blood pressure between your right and left arms. Certain factors can cause blood pressure readings to be lower or higher than normal for a short period of time:  When your blood pressure is higher when you are in a health care provider's office than when you are at home, this is called white coat hypertension. Most people with this condition do not need medicines.  When your blood pressure is higher at home than when you are in a health care provider's office, this is called masked hypertension. Most people with this condition may need medicines to control blood pressure. If you have a high blood pressure reading during one visit or you have normal blood pressure with other risk factors, you may be asked to:  Return on a  different day to have your blood pressure checked again.  Monitor your blood pressure at home for 1 week or longer. If you are diagnosed with hypertension, you may have other blood or imaging tests to help your health care provider understand your overall risk for other conditions. How is this treated? This condition is treated by making healthy lifestyle changes, such as eating healthy foods, exercising more, and reducing your alcohol intake. Your health care provider may prescribe medicine if lifestyle changes are not enough to get your blood pressure under control, and if:  Your systolic blood pressure is above 130.  Your diastolic blood pressure is above 80. Your personal target blood pressure may vary depending on your medical conditions, your age, and other factors. Follow these instructions at home: Eating and drinking  Eat a diet that is high in fiber and potassium, and low in sodium, added sugar, and fat. An example eating plan is called the DASH (Dietary Approaches to Stop Hypertension) diet. To eat this way: ? Eat plenty of fresh fruits and vegetables. Try to fill one half of your plate at each meal with fruits and  vegetables. ? Eat whole grains, such as whole-wheat pasta, brown rice, or whole-grain bread. Fill about one fourth of your plate with whole grains. ? Eat or drink low-fat dairy products, such as skim milk or low-fat yogurt. ? Avoid fatty cuts of meat, processed or cured meats, and poultry with skin. Fill about one fourth of your plate with lean proteins, such as fish, chicken without skin, beans, eggs, or tofu. ? Avoid pre-made and processed foods. These tend to be higher in sodium, added sugar, and fat.  Reduce your daily sodium intake. Most people with hypertension should eat less than 1,500 mg of sodium a day.  Do not drink alcohol if: ? Your health care provider tells you not to drink. ? You are pregnant, may be pregnant, or are planning to become pregnant.  If you drink alcohol: ? Limit how much you use to:  0-1 drink a day for women.  0-2 drinks a day for men. ? Be aware of how much alcohol is in your drink. In the [place] one drink equals one 12 oz bottle of beer (355 mL), one 5 oz glass of wine (148 mL), or one 1 oz glass of hard liquor (44 mL).   Lifestyle  Work with your health care provider to maintain a healthy body weight or to lose weight. Ask what an ideal weight is for you.  Get at least 30 minutes of exercise most days of the week. Activities may include walking, swimming, or biking.  Include exercise to strengthen your muscles (resistance exercise), such as Pilates or lifting weights, as part of your weekly exercise routine. Try to do these types of exercises for 30 minutes at least 3 days a week.  Do not use any products that contain nicotine or tobacco, such as cigarettes, e-cigarettes, and chewing tobacco. If you need help quitting, ask your health care provider.  Monitor your blood pressure at home as told by your health care provider.  Keep all follow-up visits as told by your health care provider. This is important.   Medicines  Take over-the-counter and  prescription medicines only as told by your health care provider. Follow directions carefully. Blood pressure medicines must be taken as prescribed.  Do not skip doses of blood pressure medicine. Doing this puts you at risk for problems and can make the medicine less effective.  Ask your health care provider about side effects or reactions to medicines that you should watch for. Contact a health care provider if you:  Think you are having a reaction to a medicine you are taking.  Have headaches that keep coming back (recurring).  Feel dizzy.  Have swelling in your ankles.  Have trouble with your vision. Get help right away if you:  Develop a severe headache or confusion.  Have unusual weakness or numbness.  Feel faint.  Have severe pain in your chest or abdomen.  Vomit repeatedly.  Have trouble breathing. Summary  Hypertension is when the force of blood pumping through your arteries is too strong. If this condition is not controlled, it may put you at risk for serious complications.  Your personal target blood pressure may vary depending on your medical conditions, your age, and other factors. For most people, a normal blood pressure is less than 120/80.  Hypertension is treated with lifestyle changes, medicines, or a combination of both. Lifestyle changes include losing weight, eating a healthy, low-sodium diet, exercising more, and limiting alcohol. This information is not intended to replace advice given to you by your health care provider. Make sure you discuss any questions you have with your health care provider. Document Revised: 10/23/2017 Document Reviewed: 10/23/2017 Elsevier Patient Education  2021 [employer].

## 2020-06-06 NOTE — Progress Notes (Signed)
History:  30 y.o. G0P0000 here today for contraception counseling. Pt is on OCPs and reports that her cycles have been well controlled since starting these. She reports that her father died last year and that has changed her entire lift. She is worried about her BP which was elevated on first take. She has a pos FH of HTN in multiple members. Pt denies tob use but, does smoke THC.  Pt reports not eating as well since her fathers death. She thinks she is also losing weight but, feels that she is emotionally 'ok'.    She has a gen exam with PAP at her primary care providers office and is here for refill of her OCPs.  The following portions of the patient's history were reviewed and updated as appropriate: allergies, current medications, past family history, past medical history, past social history, past surgical history and problem list.  Review of Systems:  Pertinent items are noted in HPI.    Objective:  Physical Exam Blood pressure 132/87, pulse 75, height 5\' 8"  (1.727 m), weight 298 lb (135.2 kg), last menstrual period 05/15/2020.  CONSTITUTIONAL: Well-developed, well-nourished female in no acute distress.  HENT:  Normocephalic, atraumatic EYES: Conjunctivae and EOM are normal. No scleral icterus.  NECK: Normal range of motion SKIN: Skin is warm and dry. No rash noted. Not diaphoretic.No pallor. NEUROLGIC: Alert and oriented to person, place, and time. Normal coordination.    Assessment & Plan:  Stephenie was seen today for annual exam.  Diagnoses and all orders for this visit:  Elevated blood pressure reading  Encounter for other general counseling and advice on contraception -     norgestimate-ethinyl estradiol (SPRINTEC 28) 0.25-35 MG-MCG tablet; Take 1 tablet by mouth daily.  Family history of essential hypertension  rec walking for exercise  Pt walks at [facility] [place]. rec 1 full lap. Increase to 2-3 laps by next visit.   Watch sodium in diet.   F/u in 1 year or sooner prn    Elayna Tobler L. Harraway-Smith, M.D., [facility]

## 2020-07-25 ENCOUNTER — Encounter: Payer: Self-pay | Admitting: Family Medicine

## 2020-07-27 ENCOUNTER — Other Ambulatory Visit: Payer: Self-pay

## 2020-07-27 ENCOUNTER — Telehealth (INDEPENDENT_AMBULATORY_CARE_PROVIDER_SITE_OTHER): Payer: BC Managed Care – PPO | Admitting: Family Medicine

## 2020-07-27 DIAGNOSIS — F4323 Adjustment disorder with mixed anxiety and depressed mood: Secondary | ICD-10-CM | POA: Diagnosis not present

## 2020-07-27 MED ORDER — VENLAFAXINE HCL ER 37.5 MG PO CP24: ORAL_CAPSULE | ORAL | 0 refills | Status: AC

## 2020-07-27 NOTE — Progress Notes (Signed)
[facility] [address], [address] [place], [place] [address] [phone]  Date:  07/27/2020   Name:  Joan Lee   DOB:  12-03-1990   MRN:  [id number]  PCP:  Pearline Cables, MD    Chief Complaint: Effexor (Pt would like to lower dosage. )   History of Present Illness:  Joan Lee is a 30 y.o. very pleasant female patient who presents with the following:  Patient location is her car, my location is office.  Patient identity confirmed with 2 factors, she gives consent for virtual visit today.  The patient myself are present on the call today Virtual visit today to discuss her effexor-she is interested in tapering off and stopping this medication She is currently taking 150 xr daily She feels like she is in a much better place since the sudden loss of her father 1 year ago, this triggered depression and anxiety symptoms for which we started Effexor However, at this time her symptoms are much reduced and she would like to try stopping the medicine    Patient Active Problem List   Diagnosis Date Noted  . Vitamin D deficiency 12/22/2019  . Common migraine with intractable migraine 04/20/2015    Past Medical History:  Diagnosis Date  . Common migraine with intractable migraine 04/20/2015    Past Surgical History:  Procedure Laterality Date  . WISDOM TOOTH EXTRACTION      Social History   Tobacco Use  . Smoking status: Never Smoker  . Smokeless tobacco: Never Used  Vaping Use  . Vaping Use: Never used  Substance Use Topics  . Alcohol use: No  . Drug use: No    Family History  Problem Relation Age of Onset  . Diabetes Mother   . Migraines Mother   . High blood pressure Mother   . Bell's palsy Father   . Bell's palsy Sister     Allergies  Allergen Reactions  . Tamiflu [Oseltamivir Phosphate] Shortness Of Breath    Medication list has been reviewed and updated.  Current Outpatient Medications  on File Prior to Visit  Medication Sig Dispense Refill  . norgestimate-ethinyl estradiol (SPRINTEC 28) 0.25-35 MG-MCG tablet Take 1 tablet by mouth daily. 28 tablet 12  . venlafaxine XR (EFFEXOR XR) 150 MG 24 hr capsule Take 1 capsule (150 mg total) by mouth daily with breakfast. 90 capsule 3   No current facility-administered medications on file prior to visit.    Review of Systems:  As per HPI- otherwise negative.   Physical Examination: There were no vitals filed for this visit. There were no vitals filed for this visit. There is no height or weight on file to calculate BMI. Ideal Body Weight:    Patient observed her video monitor.  She looks well, no shortness of breath or distress is noted  Assessment and Plan: Adjustment disorder with mixed anxiety and depressed mood - Plan: venlafaxine XR (EFFEXOR XR) 37.5 MG 24 hr capsule  Adjustment disorder symptoms, much improved since the loss of her father 1 year ago.  She would like to taper off and eventually stop Effexor.  Currently taking 150 mg XR.  Prescribed a tapering regimen as below  Meds ordered this encounter  Medications  . venlafaxine XR (EFFEXOR XR) 37.5 MG 24 hr capsule    Sig: Take 75 mg daily for 1-2 weeks then decrease to 37.5 mg daily for 1-2 weeks, then may take every other day  for 1-2 weeks and then stop use    Dispense:  60 capsule    Refill:  0    She will let me know if any problems with tapering, or if she needs to restart medication Video monitor used for visit today  Signed Abbe Amsterdam, MD

## 2020-09-09 DIAGNOSIS — Z20822 Contact with and (suspected) exposure to covid-19: Secondary | ICD-10-CM | POA: Diagnosis not present

## 2020-11-09 ENCOUNTER — Telehealth: Payer: Self-pay | Admitting: *Deleted

## 2020-11-09 NOTE — Telephone Encounter (Signed)
I tried to call Joan Lee back, but she did not answer and her mailbox was full.  I could not leave a message.  Her boyfriend is scheduled to come in on Friday and is scheduled as a self pay pt and a good faith estimate has been generated and uploaded to his mychart and printed for him at check in in the amt of $150.50.  $80.00 will be due at check in on Friday or pt can pay the full balance at time of service.  Labs will be billed separately.

## 2020-11-09 NOTE — Telephone Encounter (Signed)
Caller Name Maylene Crocker Caller Phone Number [phone] Patient Name Joan Lee Patient DOB 12/28/1986 Call Type Message Only Information Provided Reason for Call Request for General Office Information Initial Comment Caller states she would like to have a few payment questions answered, states her boyfriend went to the ER and had to make an appt. with the MD, and would like to know if any payments are due when visiting the office. Additional Comment Office hours provided, requesting call back. Disp. Time Disposition Final User 11/08/2020 5:46:25 PM General Information Provided Yes Little, Samira

## 2020-11-11 ENCOUNTER — Encounter: Payer: Self-pay | Admitting: Family Medicine

## 2020-11-11 ENCOUNTER — Ambulatory Visit (INDEPENDENT_AMBULATORY_CARE_PROVIDER_SITE_OTHER): Payer: BC Managed Care – PPO

## 2020-11-11 ENCOUNTER — Other Ambulatory Visit: Payer: Self-pay

## 2020-11-11 DIAGNOSIS — Z23 Encounter for immunization: Secondary | ICD-10-CM | POA: Diagnosis not present

## 2020-12-24 ENCOUNTER — Other Ambulatory Visit: Payer: Self-pay | Admitting: Family Medicine

## 2020-12-24 DIAGNOSIS — F4323 Adjustment disorder with mixed anxiety and depressed mood: Secondary | ICD-10-CM

## 2021-04-11 ENCOUNTER — Encounter (INDEPENDENT_AMBULATORY_CARE_PROVIDER_SITE_OTHER): Payer: Self-pay | Admitting: Physician Assistant

## 2021-04-11 ENCOUNTER — Encounter (INDEPENDENT_AMBULATORY_CARE_PROVIDER_SITE_OTHER): Payer: Self-pay

## 2021-04-11 ENCOUNTER — Ambulatory Visit (INDEPENDENT_AMBULATORY_CARE_PROVIDER_SITE_OTHER): Payer: BC Managed Care – PPO | Admitting: Physician Assistant

## 2021-04-11 VITALS — BP 116/80 | HR 78 | Temp 98.2°F | Resp 16 | Ht 68.5 in | Wt 305.0 lb

## 2021-04-11 DIAGNOSIS — Z0000 Encounter for general adult medical examination without abnormal findings: Secondary | ICD-10-CM

## 2021-04-11 DIAGNOSIS — F419 Anxiety disorder, unspecified: Secondary | ICD-10-CM

## 2021-04-11 DIAGNOSIS — F32A Depression, unspecified: Secondary | ICD-10-CM

## 2021-04-11 MED ORDER — SERTRALINE HCL 25 MG PO TABS
25.0000 mg | ORAL_TABLET | Freq: Every day | ORAL | 1 refills | Status: DC
Start: 2021-04-11 — End: 2021-04-28

## 2021-04-11 NOTE — Progress Notes (Signed)
[facility]                       Date of Exam: 04/11/2021 1:49 PM        Patient ID: Yolanda Jenkins is a 31 y.o. female.  Attending Physician: Nonnie Done, PA        Chief Complaint:    Chief Complaint   Patient presents with    Annual Exam               HPI:    HPI  Visit Type: Health Maintenance Visit  Work Status: working full-time  Reported Health: good health  Reported Diet: moderate compliance with recommended diet  Eats fast food during the week  Caffeine use- 2-3 cups per day.   Reported Exercise: none  Dental: dentist visit > 1 year ago  Vision: glasses and regular eye exams   Hearing: slightly decreased hearing  Immunization Status: immunizations up to date  Reproductive Health: Menses is regular. On Bcp currently.   Last pap smear- 1-2 years ago.   No hx of abnormal pap smear.     Alcohol use- 6-7 shots of liquor per day, she has slowed down  The past few days due to urinary frequency.   Drinks water throughout the day.  Depression- father passed in 2021  Lack of motivation, anxiety,   Occasional thoughts of harming herself.  No plan. hx of depression. She was on effexor when she lived in [place]  She has been off of for 6 mo.   She did do grief counseling after her father passed  EAP    Prior Screening Tests: pap smear within past 1-3 years  General Health Risks: no family history of breast cancer  Safety Elements Used: uses seat belts  A1c trend: No results found for: HGBA1C  FLP trend: No results found for: CHOL, TRIG, HDL, LDL          Problem List:    There is no problem list on file for this patient.            Current Meds:    Outpatient Medications Marked as Taking for the 04/11/21 encounter (Office Visit) with Nonnie Done, PA   Medication Sig Dispense Refill    Estarylla 0.25-35 MG-MCG per tablet Take 1 tablet by mouth daily            Allergies:    Allergies   Allergen Reactions    Oseltamivir Chest Pain and Shortness Of Breath              Past Surgical History:    No past surgical history on file.        Family History:    Family History   Problem Relation Age of Onset    Hypertension Mother     Diabetes Mother            Social History:    Social History     Tobacco Use    Smoking status: Never    Smokeless tobacco: Never   Vaping Use    Vaping Use: Every day   Substance Use Topics    Alcohol use: Yes           The following sections were reviewed this encounter by the provider:            Vital Signs:    BP 116/80 (BP Site: Right arm, Patient Position: Sitting, Cuff Size: Medium)  Pulse 78   Temp 98.2 F (36.8 C)   Resp 16   Ht 1.74 m (5' 8.5")   Wt 138.3 kg (305 lb)   LMP 04/11/2021 (Exact Date)   SpO2 99%   BMI 45.70 kg/m          ROS:    Review of Systems   General/Constitutional:   Denies Change in appetite. Denies Chills. Denies Fatigue. Denies Fever.  Ophthalmologic:   Denies Blurred vision.  ENT:   Denies Ear pain. Denies Hearing Loss. Denies Sinus pain. Denies Nasal Discharge. Denies Sore throat. Denies Hoarseness.  Cardiovascular:   Denies Chest pain. Denies Palpitations. Denies Swelling.  Respiratory:   Denies Shortness of breath. Denies Difficulty breathing. Denies Cough. Denies Wheezing. Denies Pain with respiration.   Gastrointestinal:   Denies Abdominal pain. Denies Heartburn. Denies Nausea. Denies Vomiting. Denies Constipation. Denies Diarrhea. Denies Bright red blood per rectum. Denies Melena.   Genitourinary:   Denies Genitourinary symptoms. Denies Blood in urine. Denies Incontinence. Denies Lesions. Denies Discharge. +urinary frequency  Musculoskeletal:   Denies Arthralgias. Denies Joint swelling. Denies Joint stiffness. Denies Myalgias. Denies Back pain.  Peripheral Vascular:   Denies Cold extremities.  Skin:   Denies Itching. Denies Change in Mole(s). Denies Rash.   Neurologic:   Denies Neurologic symptoms. Denies Headache. Denies Dizziness. Denies Lightheadedness. Denies Tingling. Denies Weakness.  Hematology:    Denies Easy bruising. Denies Easy Bleeding.   Endocrine:   Denies Polydipsia. Denies Polyuria. Denies Menstrual problems. Denies PMDD symptoms.   Psychiatric:   +Anxiety. + Depressed mood. Denies Mood Swings. +Difficulty sleeping.         Physical Exam:    Physical Exam   GENERAL APPEARANCE: alert, in no acute distress, well developed, well nourished, oriented to time, place, and person.   HEAD: normal appearance, atraumatic.   EYES: extraocular movement intact (EOMI), pupils equal, round, reactive to light and accommodation, sclera anicteric, conjunctiva clear, no discharge.  EARS: normal hearing, normal external auditory canals, normal tympanic membranes.   NOSE: normal nasal mucosa, no lesions.   ORAL CAVITY: normal lips, normal oral mucosa, normal oropharynx, no lesions.   THROAT: normal appearance, clear, no erythema.   NECK/THYROID: FROM, neck supple, no cervical lymphadenopathy, no thyromegaly, no neck mass palpated, no carotid bruit, no jugular venous distention.   LYMPH NODES: no palpable adenopathy.   SKIN: no rashes, no suspicious lesions.   HEART: regular rate and rhythm, no murmurs, rubs, gallops.   LUNGS: normal effort / no distress, normal breath sounds, clear to auscultation bilaterally, no wheezes, rales, rhonchi.   ABDOMEN: bowel sounds normal, soft, non-distended, non-tender, no hepatosplenomegaly.   MUSCULOSKELETAL: full range of motion, no swelling or deformity.   EXTREMITIES: no clubbing, cyanosis, or edema.   PERIPHERAL PULSES: 2+ peripheral pulses.   NEUROLOGIC: normal gait, cranial nerves 2-12 grossly intact, normal strength, tone, sensation, and reflexes.  PSYCH: flat affect, crying, cognitive function intact, speech clear.         Assessment:    1. Depression, unspecified depression type  - Ambulatory referral to Psychology  - sertraline (ZOLOFT) 25 MG tablet; Take 1 tablet (25 mg) by mouth daily  Dispense: 30 tablet; Refill: 1    2. Well female exam without gynecological exam  - POCT UA  Clinitek AX (urine dipstick); Future  - CBC and differential; Future  - Comprehensive metabolic panel; Future  - Lipid panel; Future  - TSH; Future  - Hemoglobin A1C; Future    3. Anxiety  Plan:    Health Maintenance:  Counseled on healthy lifestyle. F/U based on results.  Refer to therapy for depression, anxiety.   Start on zoloft once per day, discussed how to take and potential side effects.   Follow up in 2 weeks for medication check.           Follow-up:    No follow-ups on file.         Nonnie Done, PA

## 2021-04-13 ENCOUNTER — Telehealth (INDEPENDENT_AMBULATORY_CARE_PROVIDER_SITE_OTHER): Payer: BC Managed Care – PPO | Admitting: Physician Assistant

## 2021-04-14 ENCOUNTER — Other Ambulatory Visit (FREE_STANDING_LABORATORY_FACILITY): Payer: BC Managed Care – PPO

## 2021-04-14 ENCOUNTER — Other Ambulatory Visit (INDEPENDENT_AMBULATORY_CARE_PROVIDER_SITE_OTHER): Payer: BC Managed Care – PPO | Admitting: Physician Assistant

## 2021-04-14 DIAGNOSIS — Z0000 Encounter for general adult medical examination without abnormal findings: Secondary | ICD-10-CM

## 2021-04-14 LAB — HEMOLYSIS INDEX: Hemolysis Index: 0 Index (ref 0–24)

## 2021-04-14 LAB — LIPID PANEL
Cholesterol / HDL Ratio: 3.9 Index
Cholesterol: 210 mg/dL — ABNORMAL HIGH (ref 0–199)
HDL: 54 mg/dL (ref 40–9999)
LDL Calculated: 138 mg/dL — ABNORMAL HIGH (ref 0–99)
Triglycerides: 91 mg/dL (ref 34–149)
VLDL Calculated: 18 mg/dL (ref 10–40)

## 2021-04-14 LAB — CBC AND DIFFERENTIAL
Absolute NRBC: 0 10*3/uL (ref 0.00–0.00)
Basophils Absolute Automated: 0.02 10*3/uL (ref 0.00–0.08)
Basophils Automated: 0.3 %
Eosinophils Absolute Automated: 0.18 10*3/uL (ref 0.00–0.44)
Eosinophils Automated: 2.5 %
Hematocrit: 42.9 % (ref 34.7–43.7)
Hgb: 13.7 g/dL (ref 11.4–14.8)
Immature Granulocytes Absolute: 0.01 10*3/uL (ref 0.00–0.07)
Immature Granulocytes: 0.1 %
Instrument Absolute Neutrophil Count: 3.63 10*3/uL (ref 1.10–6.33)
Lymphocytes Absolute Automated: 2.81 10*3/uL (ref 0.42–3.22)
Lymphocytes Automated: 38.6 %
MCH: 29.8 pg (ref 25.1–33.5)
MCHC: 31.9 g/dL (ref 31.5–35.8)
MCV: 93.5 fL (ref 78.0–96.0)
MPV: 9.7 fL (ref 8.9–12.5)
Monocytes Absolute Automated: 0.63 10*3/uL (ref 0.21–0.85)
Monocytes: 8.7 %
Neutrophils Absolute: 3.63 10*3/uL (ref 1.10–6.33)
Neutrophils: 49.8 %
Nucleated RBC: 0 /100 WBC (ref 0.0–0.0)
Platelets: 356 10*3/uL — ABNORMAL HIGH (ref 142–346)
RBC: 4.59 10*6/uL (ref 3.90–5.10)
RDW: 12 % (ref 11–15)
WBC: 7.28 10*3/uL (ref 3.10–9.50)

## 2021-04-14 LAB — POCT URINALYSIS AUTOMATED (IAH)
Bilirubin, UA POCT: NEGATIVE
Glucose, UA POCT: NEGATIVE
Ketones, UA POCT: NEGATIVE mg/dL
Nitrite, UA POCT: NEGATIVE
PH, UA POCT: 6 (ref 4.6–8)
Protein, UA POCT: NEGATIVE mg/dL
Specific Gravity, UA POCT: 1.025 mg/dL (ref 1.001–1.035)
Urine Leukocytes POCT: NEGATIVE
Urobilinogen, UA POCT: 0.2 mg/dL

## 2021-04-14 LAB — COMPREHENSIVE METABOLIC PANEL
ALT: 13 U/L (ref 0–55)
AST (SGOT): 15 U/L (ref 5–41)
Albumin/Globulin Ratio: 1 (ref 0.9–2.2)
Albumin: 3.7 g/dL (ref 3.5–5.0)
Alkaline Phosphatase: 70 U/L (ref 37–117)
Anion Gap: 12 (ref 5.0–15.0)
BUN: 14 mg/dL (ref 7.0–21.0)
Bilirubin, Total: 0.1 mg/dL — ABNORMAL LOW (ref 0.2–1.2)
CO2: 22 mEq/L (ref 17–29)
Calcium: 9.3 mg/dL (ref 8.5–10.5)
Chloride: 107 mEq/L (ref 99–111)
Creatinine: 0.8 mg/dL (ref 0.4–1.0)
Globulin: 3.6 g/dL (ref 2.0–3.6)
Glucose: 83 mg/dL (ref 70–100)
Potassium: 4.6 mEq/L (ref 3.5–5.3)
Protein, Total: 7.3 g/dL (ref 6.0–8.3)
Sodium: 141 mEq/L (ref 135–145)

## 2021-04-14 LAB — HEMOGLOBIN A1C
Average Estimated Glucose: 93.9 mg/dL
Hemoglobin A1C: 4.9 % (ref 4.6–5.9)

## 2021-04-14 LAB — TSH: TSH: 1.09 u[IU]/mL (ref 0.35–4.94)

## 2021-04-14 LAB — GFR: EGFR: >60

## 2021-04-14 NOTE — Progress Notes (Signed)
Patient presented for lab visit. Left urine sample. POCT updated.

## 2021-04-17 ENCOUNTER — Encounter (INDEPENDENT_AMBULATORY_CARE_PROVIDER_SITE_OTHER): Payer: Self-pay | Admitting: Physician Assistant

## 2021-04-17 ENCOUNTER — Telehealth (INDEPENDENT_AMBULATORY_CARE_PROVIDER_SITE_OTHER): Payer: Self-pay

## 2021-04-17 NOTE — Telephone Encounter (Signed)
Yes I agree with advice given, if her symptoms persist despite stopping the medication she will need to follow up sooner.

## 2021-04-17 NOTE — Telephone Encounter (Signed)
Patient wrote on my chart stated she is having side effects of chest pain and  burning in throat after taking Zoloft this am. Advised patient to stop until upcoming appt Thursday to discuss. Please advise if different.

## 2021-04-26 ENCOUNTER — Telehealth (INDEPENDENT_AMBULATORY_CARE_PROVIDER_SITE_OTHER): Payer: BC Managed Care – PPO | Admitting: Family Medicine

## 2021-04-28 ENCOUNTER — Encounter (INDEPENDENT_AMBULATORY_CARE_PROVIDER_SITE_OTHER): Payer: Self-pay | Admitting: Family Medicine

## 2021-04-28 ENCOUNTER — Telehealth (INDEPENDENT_AMBULATORY_CARE_PROVIDER_SITE_OTHER): Payer: BC Managed Care – PPO | Admitting: Family Medicine

## 2021-04-28 DIAGNOSIS — F32A Depression, unspecified: Secondary | ICD-10-CM

## 2021-04-28 DIAGNOSIS — F419 Anxiety disorder, unspecified: Secondary | ICD-10-CM

## 2021-04-28 MED ORDER — ESCITALOPRAM OXALATE 10 MG PO TABS
10.0000 mg | ORAL_TABLET | Freq: Every day | ORAL | 1 refills | Status: DC
Start: 2021-04-28 — End: 2021-06-29

## 2021-04-28 NOTE — Progress Notes (Signed)
[facility]                       Date of Virtual Visit: 04/28/2021 1:45 PM        Patient ID: Yolanda Jenkins is a 31 y.o. female.  Attending Physician: Harvel Quale, MD       Telemedicine Eligibility:    State Location:  [x]  [place]    Physical Location:  [x]  Home  []  Office  []  Other:    Patient Identity Verification:  [x]  State Issued ID  []  Insurance Eligibility Check  [x]  Other: Verbal consent has been obtained from the patient to conduct this video visit encounter to minimize exposure to COVID-19. Authorization given to bill insurance for visit.     Physical Address and Phone Verification (for 911):  [x]  Yes  []  No    Personal identity shared with patient:  [x]  Yes  []  No    Education on nature of video visit shared with patient:  [x]  Yes  []  No    Emergency plan agreed upon with patient:  [x]  Yes  []  No    Visit terminated since not appropriate for virtual care:  [x]  N/A  []  Reason:         Chief Complaint:    Chief Complaint   Patient presents with    Medication Reaction               HPI:    HPI  Pt presents for f/u anxiety and depression.  Pt saw [place] on 04/11/21 and rx'd Zoloft 25 mg.  Pt states she took one dose and her throat hurt when taking it.  Pt feeling fine now.  Pt states she had a similar reaction to Tamiflu.  Pt would like to try a different antidepressant.   Pt states she was previously on Effexor.  Pt states Effexor helped some, but she would feel bad if she missed a dose.  Pt states her previous PCP weaned her off Effexor.  Pt states depression after her father passed.  Pt states lack of motivation and anxiety.  Pt denies suicidal ideation.    A1c trend:   Lab Results   Component Value Date    HGBA1C 4.9 04/14/2021     FLP trend:   Lab Results   Component Value Date    CHOL 210 (H) 04/14/2021    TRIG 91 04/14/2021    HDL 54 04/14/2021    LDL 138 (H) 04/14/2021             Problem List:    There is no problem list on file for this patient.             Current Meds:    No outpatient medications have been marked as taking for the 04/28/21 encounter (Telemedicine Visit) with Harvel Quale, MD.          Allergies:    Allergies   Allergen Reactions    Oseltamivir Chest Pain and Shortness Of Breath             Past Surgical History:    History reviewed. No pertinent surgical history.        Family History:    Family History   Problem Relation Age of Onset    Hypertension Mother     Diabetes Mother            Social History:    Social History  Tobacco Use    Smoking status: Never    Smokeless tobacco: Never   Vaping Use    Vaping Use: Every day   Substance Use Topics    Alcohol use: Yes           The following sections were reviewed this encounter by the provider:            Vital Signs:    LMP 04/11/2021 (Exact Date)          ROS:    Review of Systems   Respiratory:  Negative for cough, choking, shortness of breath and stridor.    Cardiovascular:  Negative for chest pain.   Gastrointestinal:  Negative for abdominal pain, nausea and vomiting.   Neurological:  Negative for dizziness and headaches.   Psychiatric/Behavioral:  Positive for dysphoric mood. Negative for suicidal ideas. The patient is nervous/anxious.             Physical Exam:    Physical Exam   GENERAL APPEARANCE: well developed, well nourished, no acute distress  HEAD: normal appearance, atraumatic, no swelling of face  EARS: normal hearing  MOUTH: normal appearance  THROAT: normal voice  NECK: full range of motion without pain  LUNGS/CHEST: unlabored, speaking in full sentences, no audible stridor, no audible wheezing, no prolonged expiratory phase, normal respiratory rate  PSYCH: alert and oriented, appropriate affect, appropriate mood, normal speech        Assessment:    1. Depression, unspecified depression type  - escitalopram (Lexapro) 10 MG tablet; Take 1 tablet (10 mg) by mouth daily  Dispense: 30 tablet; Refill: 1    2. Anxiety  - escitalopram (Lexapro) 10 MG tablet; Take 1 tablet (10 mg) by mouth  daily  Dispense: 30 tablet; Refill: 1            Plan:    Counseled on med. Counseled on symptomatic tx. Emphasized daily compliance with med. F/U with [place] in 1 month. Given s/sxs to seek tx sooner.         Follow-up:    No follow-ups on file.         Mitzie Marlar Lowella Dandy, MD

## 2021-06-11 ENCOUNTER — Telehealth (INDEPENDENT_AMBULATORY_CARE_PROVIDER_SITE_OTHER): Payer: Self-pay | Admitting: Family Medicine

## 2021-06-11 NOTE — Telephone Encounter (Signed)
Pt called regarding being out of OCP, called in 1 pack for pt for temp refill.

## 2021-06-29 ENCOUNTER — Telehealth (INDEPENDENT_AMBULATORY_CARE_PROVIDER_SITE_OTHER): Payer: Self-pay | Admitting: Physician Assistant

## 2021-06-29 DIAGNOSIS — F419 Anxiety disorder, unspecified: Secondary | ICD-10-CM

## 2021-06-29 DIAGNOSIS — F32A Depression, unspecified: Secondary | ICD-10-CM

## 2021-06-29 DIAGNOSIS — Z3041 Encounter for surveillance of contraceptive pills: Secondary | ICD-10-CM

## 2021-06-29 MED ORDER — ESTARYLLA 0.25-35 MG-MCG PO TABS
1.0000 | ORAL_TABLET | Freq: Every day | ORAL | 2 refills | Status: DC
Start: 2021-06-29 — End: 2022-03-08

## 2021-06-29 MED ORDER — ESCITALOPRAM OXALATE 10 MG PO TABS
10.0000 mg | ORAL_TABLET | Freq: Every day | ORAL | 1 refills | Status: DC
Start: 2021-06-29 — End: 2021-09-11

## 2021-06-29 NOTE — Progress Notes (Signed)
[facility]                       Date of Virtual Visit: 06/29/2021 3:42 PM        Patient ID: Yolanda Jenkins is a 31 y.o. female.  Attending Physician: Peggye Ley Kihanna Kamiya, PA       Telemedicine Eligibility:    State Location:  [x]  [place]    Physical Location:  [x]  Home  []  Office  []  Other:    Patient Identity Verification:  [x]  State Issued ID  []  Insurance Eligibility Check  [x]  Other: Verbal consent has been obtained from the patient to conduct this video visit encounter to minimize exposure to COVID-19. Authorization given to bill insurance for visit.     Physical Address and Phone Verification (for 911):  [x]  Yes  []  No    Personal identity shared with patient:  [x]  Yes  []  No    Education on nature of video visit shared with patient:  [x]  Yes  []  No    Emergency plan agreed upon with patient:  [x]  Yes  []  No    Visit terminated since not appropriate for virtual care:  [x]  N/A  []  Reason:         Chief Complaint:    Chief Complaint   Patient presents with    Medication Refill               HPI:    31 year old female presents for a virtual visit to follow up on birth control prescription.    No side effects.   Menses is regular.     Anxiety/depression is stable on the lexapro.     A1c trend:   Lab Results   Component Value Date    HGBA1C 4.9 04/14/2021     FLP trend:   Lab Results   Component Value Date    CHOL 210 (H) 04/14/2021    TRIG 91 04/14/2021    HDL 54 04/14/2021    LDL 138 (H) 04/14/2021             Problem List:    There is no problem list on file for this patient.            Current Meds:    Outpatient Medications Marked as Taking for the 06/29/21 encounter (Telemedicine Visit) with Domingo Dimes, Peggye Ley, PA   Medication Sig Dispense Refill    [DISCONTINUED] escitalopram (Lexapro) 10 MG tablet Take 1 tablet (10 mg) by mouth daily 30 tablet 1    [DISCONTINUED] Estarylla 0.25-35 MG-MCG per tablet Take 1 tablet by mouth daily            Allergies:    Allergies    Allergen Reactions    Oseltamivir Chest Pain and Shortness Of Breath             Past Surgical History:    No past surgical history on file.        Family History:    Family History   Problem Relation Age of Onset    Hypertension Mother     Diabetes Mother            Social History:    Social History     Tobacco Use    Smoking status: Never    Smokeless tobacco: Never   Vaping Use    Vaping status: Every Day   Substance Use Topics    Alcohol use: Yes  The following sections were reviewed this encounter by the provider:            Vital Signs:    LMP 06/06/2021 (Exact Date)          ROS:    Review of Systems   Constitutional:  Negative for fever.   Respiratory:  Negative for shortness of breath.    Cardiovascular:  Negative for chest pain.   Genitourinary:  Negative for menstrual problem.              Physical Exam:    Physical Exam   GENERAL APPEARANCE: well developed, well nourished, no acute distress  HEAD: normal appearance, atraumatic, no swelling of face  EYES: EOMI, PERRLA, no discharge, conjunctiva normal  EARS: normal hearing, no pain with pinna manipulation, no drainage, no mastoid tenderness  NOSE: no nasal discharge  MOUTH: normal appearance  LUNGS/CHEST: unlabored, speaking in full sentences, no audible stridor, no audible wheezing, no prolonged expiratory phase, normal respiratory rate  NEURO: facial movement symmetric, normal speech, normal head movement, normal gait  PSYCH: alert and oriented, appropriate affect, appropriate mood, normal speech        Assessment:    1. Depression, unspecified depression type  - escitalopram (Lexapro) 10 MG tablet; Take 1 tablet (10 mg) by mouth daily  Dispense: 90 tablet; Refill: 1    2. Anxiety  - escitalopram (Lexapro) 10 MG tablet; Take 1 tablet (10 mg) by mouth daily  Dispense: 90 tablet; Refill: 1    3. Encounter for surveillance of contraceptive pills            Plan:    Counseled on med. Refilled chronic medications.        Follow-up:    No  follow-ups on file.         Nonnie Done, PA

## 2021-09-10 ENCOUNTER — Other Ambulatory Visit (INDEPENDENT_AMBULATORY_CARE_PROVIDER_SITE_OTHER): Payer: Self-pay | Admitting: Family Medicine

## 2021-09-10 DIAGNOSIS — F32A Depression, unspecified: Secondary | ICD-10-CM

## 2021-09-10 DIAGNOSIS — F419 Anxiety disorder, unspecified: Secondary | ICD-10-CM

## 2021-09-22 ENCOUNTER — Ambulatory Visit (INDEPENDENT_AMBULATORY_CARE_PROVIDER_SITE_OTHER): Payer: Commercial Managed Care - POS | Admitting: Physician Assistant

## 2021-09-22 ENCOUNTER — Encounter (INDEPENDENT_AMBULATORY_CARE_PROVIDER_SITE_OTHER): Payer: Self-pay | Admitting: Physician Assistant

## 2021-09-22 VITALS — BP 136/84 | HR 82 | Temp 98.3°F | Resp 18 | Ht 68.0 in | Wt 284.0 lb

## 2021-09-22 DIAGNOSIS — J101 Influenza due to other identified influenza virus with other respiratory manifestations: Secondary | ICD-10-CM

## 2021-09-22 NOTE — Progress Notes (Signed)
Subjective:      Patient ID: Yolanda Jenkins is a 31 y.o. female.    Chief Complaint:  Chief Complaint   Patient presents with    uc follow up       HPI:  31 y/o female presents for UC follow up.  Diagnosed with flu b 1 week ago.   As of today still having mild cough and fatigue.   She was on a cruise last week. Friday she started to have vomiting. She was having trouble keeping liquids down.   She was also having diarrhea.   She was feeling lightheaded.  Diagnosed with the flu. No fevers.  She is feeling better, she went back to work yesterday.   No more vomiting  Diarrhea is intermittent.         Problem List:  Patient Active Problem List   Diagnosis    Allergic rhinitis    Common migraine with intractable migraine    Dysmenorrhea    Obesity    Vitamin D deficiency    Abnormal weight gain       Current Medications:  Current Outpatient Medications   Medication Sig Dispense Refill    escitalopram (LEXAPRO) 10 MG tablet TAKE 1 TABLET(10 MG) BY MOUTH DAILY 90 tablet 0    Estarylla 0.25-35 MG-MCG per tablet Take 1 tablet by mouth daily 90 tablet 2     No current facility-administered medications for this visit.       Allergies:  Allergies   Allergen Reactions    Oseltamivir Chest Pain and Shortness Of Breath       Past Medical History:  History reviewed. No pertinent past medical history.    Past Surgical History:  History reviewed. No pertinent surgical history.    Family History:  Family History   Problem Relation Age of Onset    Hypertension Mother     Diabetes Mother        Social History:  Social History     Socioeconomic History    Marital status: Single   Tobacco Use    Smoking status: Never    Smokeless tobacco: Never   Vaping Use    Vaping Use: Every day   Substance and Sexual Activity    Alcohol use: Yes     Social Determinants of Health     Financial Resource Strain: Low Risk  (04/10/2021)    Overall Financial Resource Strain (CARDIA)     Difficulty of Paying Living Expenses: Not hard at all   Food  Insecurity: No Food Insecurity (04/10/2021)    Hunger Vital Sign     Worried About Running Out of Food in the Last Year: Never true     Ran Out of Food in the Last Year: Never true   Transportation Needs: No Transportation Needs (04/10/2021)    PRAPARE - Therapist, art (Medical): No     Lack of Transportation (Non-Medical): No   Physical Activity: Inactive (04/10/2021)    Exercise Vital Sign     Days of Exercise per Week: 0 days     Minutes of Exercise per Session: 0 min   Stress: Stress Concern Present (04/10/2021)    [employer] of Occupational Health - Occupational Stress Questionnaire     Feeling of Stress : Very much   Social Connections: Socially Isolated (04/10/2021)    Social Connection and Isolation Panel [NHANES]     Frequency of Communication with Friends and Family: More than three times  a week     Frequency of Social Gatherings with Friends and Family: Never     Attends Religious Services: Never     Database administrator or Organizations: No     Attends Engineer, structural: Patient refused     Marital Status: Never married   Intimate Partner Violence: Not At Risk (04/10/2021)    Humiliation, Afraid, Rape, and Kick questionnaire     Fear of Current or Ex-Partner: No     Emotionally Abused: No     Physically Abused: No     Sexually Abused: No   Housing Stability: Low Risk  (04/10/2021)    Housing Stability Vital Sign     Unable to Pay for Housing in the Last Year: No     Number of Places Lived in the Last Year: 2     Unstable Housing in the Last Year: No        The following sections were reviewed this encounter by the provider:        ROS:  Review of Systems   Constitutional:  Positive for fatigue. Negative for fever.   HENT:  Positive for congestion. Negative for ear pain, postnasal drip, sinus pressure, sinus pain and sore throat.    Respiratory:  Positive for cough (some production). Negative for shortness of breath and wheezing.    Gastrointestinal:  Positive for  diarrhea (intermittent). Negative for nausea and vomiting (no vomiting since Friday the 21st).   Neurological:  Negative for dizziness, light-headedness and headaches.       Vitals:  BP 136/84 (BP Site: Left arm, Patient Position: Sitting, Cuff Size: Large)   Pulse 82   Temp 98.3 F (36.8 C) (Tympanic)   Resp 18   Ht 1.727 m (5\' 8" )   Wt 128.8 kg (284 lb)   SpO2 96%   BMI 43.18 kg/m      Objective:     Physical Exam:  Physical Exam  Vitals and nursing note reviewed.   Constitutional:       General: She is not in acute distress.     Appearance: She is obese. She is not ill-appearing or toxic-appearing.   HENT:      Head: Normocephalic and atraumatic.      Right Ear: Tympanic membrane, ear canal and external ear normal.      Left Ear: Tympanic membrane, ear canal and external ear normal.      Mouth/Throat:      Mouth: Mucous membranes are moist.   Eyes:      Conjunctiva/sclera: Conjunctivae normal.   Cardiovascular:      Rate and Rhythm: Normal rate and regular rhythm.   Pulmonary:      Effort: Pulmonary effort is normal.      Breath sounds: Normal breath sounds.   Musculoskeletal:      Cervical back: Neck supple. No tenderness.   Lymphadenopathy:      Cervical: No cervical adenopathy.   Skin:     Findings: No rash.   Neurological:      Mental Status: She is alert.   Psychiatric:         Mood and Affect: Mood normal.          Assessment:     1. Influenza B      Plan:   Reviewed UC results and records  Patient to continue rest, hydration, symptoms improving  Call with any new or worsening symptoms    Nonnie Done, PA

## 2021-11-22 ENCOUNTER — Other Ambulatory Visit (INDEPENDENT_AMBULATORY_CARE_PROVIDER_SITE_OTHER): Payer: Self-pay | Admitting: Family Medicine

## 2022-01-18 ENCOUNTER — Other Ambulatory Visit (INDEPENDENT_AMBULATORY_CARE_PROVIDER_SITE_OTHER): Payer: Self-pay | Admitting: Physician Assistant

## 2022-01-18 DIAGNOSIS — F32A Depression, unspecified: Secondary | ICD-10-CM

## 2022-01-18 DIAGNOSIS — F419 Anxiety disorder, unspecified: Secondary | ICD-10-CM

## 2022-03-08 ENCOUNTER — Other Ambulatory Visit (INDEPENDENT_AMBULATORY_CARE_PROVIDER_SITE_OTHER): Payer: Self-pay | Admitting: Physician Assistant

## 2022-05-31 ENCOUNTER — Other Ambulatory Visit (INDEPENDENT_AMBULATORY_CARE_PROVIDER_SITE_OTHER): Payer: Self-pay | Admitting: Physician Assistant

## 2022-08-17 ENCOUNTER — Ambulatory Visit (INDEPENDENT_AMBULATORY_CARE_PROVIDER_SITE_OTHER): Payer: Commercial Managed Care - POS | Admitting: Family Medicine

## 2022-08-17 ENCOUNTER — Encounter (INDEPENDENT_AMBULATORY_CARE_PROVIDER_SITE_OTHER): Payer: Self-pay | Admitting: Family Medicine

## 2022-08-17 VITALS — BP 138/88 | HR 85 | Temp 97.6°F | Resp 18 | Ht 68.0 in | Wt 275.0 lb

## 2022-08-17 DIAGNOSIS — Z13220 Encounter for screening for lipoid disorders: Secondary | ICD-10-CM

## 2022-08-17 DIAGNOSIS — Z0000 Encounter for general adult medical examination without abnormal findings: Secondary | ICD-10-CM

## 2022-08-17 DIAGNOSIS — Z130 Encounter for screening for diseases of the blood and blood-forming organs and certain disorders involving the immune mechanism: Secondary | ICD-10-CM

## 2022-08-17 DIAGNOSIS — F411 Generalized anxiety disorder: Secondary | ICD-10-CM

## 2022-08-17 DIAGNOSIS — Z1159 Encounter for screening for other viral diseases: Secondary | ICD-10-CM

## 2022-08-17 DIAGNOSIS — Z13228 Encounter for screening for other metabolic disorders: Secondary | ICD-10-CM

## 2022-08-17 DIAGNOSIS — Z1329 Encounter for screening for other suspected endocrine disorder: Secondary | ICD-10-CM

## 2022-08-17 DIAGNOSIS — Z131 Encounter for screening for diabetes mellitus: Secondary | ICD-10-CM

## 2022-08-17 LAB — LAB USE ONLY - CBC WITH DIFFERENTIAL
Absolute Basophils: 0.04 10*3/uL (ref 0.00–0.08)
Absolute Eosinophils: 0.16 10*3/uL (ref 0.00–0.44)
Absolute Immature Granulocytes: 0.02 10*3/uL (ref 0.00–0.07)
Absolute Lymphocytes: 2.88 10*3/uL (ref 0.42–3.22)
Absolute Monocytes: 0.51 10*3/uL (ref 0.21–0.85)
Absolute Neutrophils: 4.1 10*3/uL (ref 1.10–6.33)
Absolute nRBC: 0 10*3/uL (ref <=0.00)
Basophils %: 0.5 %
Eosinophils %: 2.1 %
Hematocrit: 41.1 % (ref 34.7–43.7)
Hemoglobin: 13.4 g/dL (ref 11.4–14.8)
Immature Granulocytes %: 0.3 %
Lymphocytes %: 37.4 %
MCH: 30.2 pg (ref 25.1–33.5)
MCHC: 32.6 g/dL (ref 31.5–35.8)
MCV: 92.8 fL (ref 78.0–96.0)
MPV: 9.8 fL (ref 8.9–12.5)
Monocytes %: 6.6 %
Neutrophils %: 53.1 %
Platelet Count: 356 10*3/uL — ABNORMAL HIGH (ref 142–346)
Preliminary Absolute Neutrophil Count: 4.1 10*3/uL (ref 1.10–6.33)
RBC: 4.43 10*6/uL (ref 3.90–5.10)
RDW: 13 % (ref 11–15)
WBC: 7.71 10*3/uL (ref 3.10–9.50)
nRBC %: 0 /100 WBC (ref <=0.0)

## 2022-08-17 LAB — LAB USE ONLY - GOLD SST HOLD TUBE

## 2022-08-17 MED ORDER — ESCITALOPRAM OXALATE 10 MG PO TABS
10.0000 mg | ORAL_TABLET | Freq: Every day | ORAL | 1 refills | Status: DC
Start: 2022-08-17 — End: 2023-05-09

## 2022-08-17 NOTE — Progress Notes (Signed)
[facility]                       Date of Exam: 08/17/2022 8:49 AM        Patient ID: Yolanda Jenkins is a 32 y.o. female.  Attending Physician: Harvel Quale, MD        Chief Complaint:    Chief Complaint   Patient presents with    Annual Exam               HPI:    HPI  Pt presents for fasting physical.  Pt working on diet and exercise.  Pt states she ran out of Lexapro 10 mg several months ago.  Pt states she had enough to wean herself off.  Pt states some increased stress and anxiety since then.  Pt denies depression.  Pt would like to restart.  Pt states the only side effect she noticed from Lexapro was decreased appetite.  Pt otherwise feeling well.    A1c trend:   Lab Results   Component Value Date    HGBA1C 4.9 04/14/2021     FLP trend:   Lab Results   Component Value Date    CHOL 210 (H) 04/14/2021    TRIG 91 04/14/2021    HDL 54 04/14/2021    LDL 138 (H) 04/14/2021             Problem List:    Patient Active Problem List   Diagnosis    Allergic rhinitis    Common migraine with intractable migraine    Dysmenorrhea    Obesity    Vitamin D deficiency    Abnormal weight gain    GAD (generalized anxiety disorder)             Current Meds:    Outpatient Medications Marked as Taking for the 08/17/22 encounter (Office Visit) with Harvel Quale, MD   Medication Sig Dispense Refill    Estarylla 0.25-35 MG-MCG per tablet TAKE 1 TABLET BY MOUTH DAILY 84 tablet 0          Allergies:    Allergies   Allergen Reactions    Oseltamivir Chest Pain and Shortness Of Breath             Past Surgical History:    History reviewed. No pertinent surgical history.        Family History:    Family History   Problem Relation Age of Onset    Hypertension Mother     Diabetes Mother            Social History:    Social History     Tobacco Use    Smoking status: Never    Smokeless tobacco: Never   Vaping Use    Vaping status: Every Day   Substance Use Topics    Alcohol use: Yes    Drug use: Yes     Types:  Marijuana           The following sections were reviewed this encounter by the provider:   Tobacco  Allergies  Meds  Problems  Med Hx  Surg Hx  Fam Hx             Vital Signs:    BP 138/88 (BP Site: Right arm, Patient Position: Sitting, Cuff Size: Large)   Pulse 85   Temp 97.6 F (36.4 C) (Tympanic)   Resp 18   Ht 1.727 m (5'  8")   Wt 124.7 kg (275 lb)   LMP 08/13/2022 (Exact Date)   SpO2 99%   BMI 41.81 kg/m          ROS:    Review of Systems   General/Constitutional:   Denies Fever.  Ophthalmologic:   Denies Blurred vision.  ENT:   Denies Ear pain. Denies Hearing Loss. Denies Sinus pain. Denies Nasal Discharge. Denies Sore throat. Denies Hoarseness.  Cardiovascular:   Denies Chest pain. Denies Palpitations. Denies Swelling.  Respiratory:   Denies Shortness of breath. Denies Cough.   Gastrointestinal:   Denies Abdominal pain. Denies Nausea. Denies Vomiting. Denies Constipation. Denies Diarrhea.  Genitourinary:   Denies Genitourinary symptoms.   Musculoskeletal:   Denies Arthralgias. Denies Back pain.  Skin:   Denies Rash.   Neurologic:   Denies Headache. Denies Dizziness.   Psychiatric:   Anxiety. Denies Depressed mood.        Physical Exam:    Physical Exam   GENERAL APPEARANCE: alert, in no acute distress, well developed, well nourished, oriented to time, place, and person.   HEAD: normal appearance, atraumatic.   EYES: extraocular movement intact (EOMI), pupils equal, round, reactive to light and accommodation, sclera anicteric, conjunctiva clear, no discharge.  EARS: normal hearing, normal external auditory canals, normal tympanic membranes.   NOSE: normal nasal mucosa  THROAT: normal appearance, clear, no erythema.   NECK/THYROID: FROM, neck supple, no cervical lymphadenopathy, no thyromegaly, no neck mass palpated  HEART: regular rate and rhythm, no murmurs, rubs, gallops.   LUNGS: normal effort / no distress, normal breath sounds, clear to auscultation bilaterally, no wheezes, rales,  rhonchi.   ABDOMEN: bowel sounds normal, soft, non-distended, non-tender, no hepatosplenomegaly.   MUSCULOSKELETAL: full range of motion  NEUROLOGIC: normal gait, cranial nerves 2-12 grossly intact  PSYCH: normal affect, normal mood, cognitive function intact, speech clear.         Assessment:    1. Annual physical exam    2. Screening for diabetes mellitus  - Comprehensive Metabolic Panel; Future  - Hemoglobin A1C; Future    3. Need for lipid screening  - Lipid Panel; Future    4. Screening for endocrine, metabolic and immunity disorder  - CBC with Differential; Future  - TSH; Future    5. Need for hepatitis C screening test  - Hepatitis C Antibody, Total; Future    6. GAD (generalized anxiety disorder)  - escitalopram (LEXAPRO) 10 MG tablet; Take 1 tablet (10 mg) by mouth daily  Dispense: 90 tablet; Refill: 1            Plan:    Counseled on healthy lifestyle. F/U based on results.  Counseled on med. Counseled on symptomatic tx. F/U in 6 months. Given s/sxs to seek tx sooner.        Follow-up:    No follow-ups on file.         Pernell Dikes Lowella Dandy, MD

## 2022-08-18 LAB — LIPID PANEL
Cholesterol / HDL Ratio: 3.3 Index
Cholesterol: 194 mg/dL (ref <=199)
HDL: 58 mg/dL (ref >=40)
LDL Calculated: 116 mg/dL (ref 0–129)
Triglycerides: 101 mg/dL (ref 34–149)
VLDL Calculated: 20 mg/dL (ref 10–40)

## 2022-08-18 LAB — COMPREHENSIVE METABOLIC PANEL
ALT: 9 U/L (ref 0–55)
AST (SGOT): 13 U/L (ref 5–41)
Albumin/Globulin Ratio: 1.1 (ref 0.9–2.2)
Albumin: 3.8 g/dL (ref 3.5–5.0)
Alkaline Phosphatase: 62 U/L (ref 37–117)
Anion Gap: 8 (ref 5.0–15.0)
BUN: 11 mg/dL (ref 7–21)
Bilirubin, Total: 0.3 mg/dL (ref 0.2–1.2)
CO2: 23 mEq/L (ref 17–29)
Calcium: 9.1 mg/dL (ref 8.5–10.5)
Chloride: 107 mEq/L (ref 99–111)
Creatinine: 0.8 mg/dL (ref 0.4–1.0)
GFR: >60 mL/min/{1.73_m2} (ref >=60.0)
Globulin: 3.6 g/dL (ref 2.0–3.6)
Glucose: 80 mg/dL (ref 70–100)
Hemolysis Index: 2 Index
Potassium: 4.4 mEq/L (ref 3.5–5.3)
Protein, Total: 7.4 g/dL (ref 6.0–8.3)
Sodium: 138 mEq/L (ref 135–145)

## 2022-08-18 LAB — HEMOGLOBIN A1C
Average Estimated Glucose: 93.9 mg/dL
Hemoglobin A1C: 4.9 % (ref 4.6–5.6)

## 2022-08-18 LAB — HEPATITIS C ANTIBODY, TOTAL: Hepatitis C Antibody: NONREACTIVE

## 2022-08-18 LAB — TSH: TSH: 0.88 u[IU]/mL (ref 0.35–4.94)

## 2022-08-23 ENCOUNTER — Other Ambulatory Visit (INDEPENDENT_AMBULATORY_CARE_PROVIDER_SITE_OTHER): Payer: Self-pay | Admitting: Physician Assistant

## 2022-11-16 ENCOUNTER — Other Ambulatory Visit (INDEPENDENT_AMBULATORY_CARE_PROVIDER_SITE_OTHER): Payer: Self-pay | Admitting: Physician Assistant

## 2023-02-28 ENCOUNTER — Other Ambulatory Visit (INDEPENDENT_AMBULATORY_CARE_PROVIDER_SITE_OTHER): Payer: Self-pay | Admitting: Physician Assistant

## 2023-04-18 ENCOUNTER — Other Ambulatory Visit (INDEPENDENT_AMBULATORY_CARE_PROVIDER_SITE_OTHER): Payer: Self-pay | Admitting: Physician Assistant

## 2023-04-23 ENCOUNTER — Encounter (INDEPENDENT_AMBULATORY_CARE_PROVIDER_SITE_OTHER): Payer: Self-pay | Admitting: Family Medicine

## 2023-04-24 MED ORDER — NORGESTIMATE-ETH ESTRADIOL 0.25-35 MG-MCG PO TABS
1.0000 | ORAL_TABLET | Freq: Every day | ORAL | 0 refills | Status: AC
Start: 2023-04-24 — End: ?

## 2023-05-09 ENCOUNTER — Ambulatory Visit (INDEPENDENT_AMBULATORY_CARE_PROVIDER_SITE_OTHER): Payer: Commercial Managed Care - POS | Admitting: Physician Assistant

## 2023-05-09 ENCOUNTER — Encounter (INDEPENDENT_AMBULATORY_CARE_PROVIDER_SITE_OTHER): Payer: Self-pay | Admitting: Physician Assistant

## 2023-05-09 VITALS — BP 126/86 | HR 110 | Temp 99.2°F | Resp 18 | Wt 265.0 lb

## 2023-05-09 DIAGNOSIS — B351 Tinea unguium: Secondary | ICD-10-CM

## 2023-05-09 DIAGNOSIS — L853 Xerosis cutis: Secondary | ICD-10-CM

## 2023-05-09 DIAGNOSIS — B353 Tinea pedis: Secondary | ICD-10-CM

## 2023-05-09 MED ORDER — TRIAMCINOLONE ACETONIDE 0.1 % EX CREA
TOPICAL_CREAM | Freq: Two times a day (BID) | CUTANEOUS | 1 refills | Status: AC
Start: 2023-05-09 — End: ?

## 2023-05-09 MED ORDER — KETOCONAZOLE 2 % EX CREA
TOPICAL_CREAM | Freq: Every day | CUTANEOUS | 1 refills | Status: AC
Start: 2023-05-09 — End: ?

## 2023-05-09 NOTE — Progress Notes (Signed)
 Patient ID: Yolanda Jenkins is a 33 y.o. female.        Chief Complaint:    No chief complaint on file.              HPI:    33 y.o female presents for dry skin. She states for the past few months.   Left foot she has cracks on her heel. Random on the bottom of her feet.    Pt report dry flaky skin in the heels, arms and the side of the stomach  Pt report her heel are dry cracks and report bleeding  Pt states there is pain when she puts weight on bother foot   Pt has been using itch relief lotion with some relief   Recently itchy patches on her arms, behind her knees, torso, neck.     She's uses more sensitive skin products. Lubriderm body wash. Normally no issues with her skin. Occasionally just some dry skin.     Diet has been the same. She has tried lotions, creams, aquafor, foot baths.               Problem List:    Problem List[1]          Current Meds:    Medications Taking[2]       Allergies:    Allergies[3]          Past Surgical History:    Past Surgical History[4]        Family History:    Family History[5]        Social History:    Social History[6]        The following sections were reviewed this encounter by the provider: Meds    Problems             Vital Signs:    BP 126/86 (BP Site: Left arm, Patient Position: Sitting, Cuff Size: Large)   Pulse (!) 110   Temp 99.2 F (37.3 C) (Tympanic)   Resp 18   Wt 120.2 kg (265 lb)   SpO2 99%   BMI 40.29 kg/m          ROS:    Review of Systems   Constitutional:  Negative for fever.   Musculoskeletal:  Negative for myalgias.   Skin:  Positive for rash.              Physical Exam:    Physical Exam  Vitals and nursing note reviewed.   Constitutional:       General: She is not in acute distress.  HENT:      Head: Normocephalic and atraumatic.   Cardiovascular:      Rate and Rhythm: Normal rate.      Pulses:           Dorsalis pedis pulses are 2+ on the right side and 2+ on the left side.        Posterior tibial pulses are 2+ on the right side and  2+ on the left side.   Pulmonary:      Effort: Pulmonary effort is normal.   Feet:      Right foot:      Skin integrity: Callus and dry skin present.      Toenail Condition: Right toenails are abnormally thick.      Left foot:      Skin integrity: Callus and dry skin present.      Toenail Condition: Left toenails are abnormally thick.  Comments: Thickened callus skin over bilateral heels  No signs of infection, redness, ttp, drainage.  White discoloration between toes bilaterally.     Skin:     Findings: Rash (erythematous dry patches on inner elbows, left and right side of neck and right flank) present.   Neurological:      Mental Status: She is alert.   Psychiatric:         Mood and Affect: Mood normal.              Assessment:    1. Dry skin dermatitis  - triamcinolone  (KENALOG ) 0.1 % cream; Apply topically 2 (two) times daily Apply to affected area twice daily  Dispense: 45 g; Refill: 1    2. Onychomycosis  - Referral to Podiatry - EXTERNAL; Future    3. Tinea pedis of both feet  - Referral to Podiatry - EXTERNAL; Future  - ketoconazole  (NIZORAL ) 2 % cream; Apply topically daily Use for up to 6 weeks.  Dispense: 30 g; Refill: 1            Plan:    Will refer to podiatry due to needing calluses removed on heels  Discussed most likely fungal on feet, try to keep as dry as possible  Can use antifungal cream daily.   For dermatitis on arms, torso, neck, recommend steroid cream twice per day  For 5-7 days at a time  Can use curel and continue lubriderm.            Follow-up:    No follow-ups on file.         Laymon CROME Brenlee Koskela, PA                     [1]   Patient Active Problem List  Diagnosis    Allergic rhinitis    Common migraine with intractable migraine    Dysmenorrhea    Obesity    Vitamin D deficiency    Abnormal weight gain    GAD (generalized anxiety disorder)   [2]   Outpatient Medications Marked as Taking for the 05/09/23 encounter (Office Visit) with Priscella Laymon CROME, PA   Medication Sig  Dispense Refill    norgestimate -ethinyl estradiol  (Estarylla ) 0.25-35 MG-MCG per tablet Take 1 tablet by mouth daily 84 tablet 0   [3]   Allergies  Allergen Reactions    Oseltamivir Chest Pain and Shortness Of Breath   [4] History reviewed. No pertinent surgical history.  [5]   Family History  Problem Relation Name Age of Onset    Hypertension Mother      Diabetes Mother     [6]   Social History  Tobacco Use    Smoking status: Never    Smokeless tobacco: Never   Vaping Use    Vaping status: Every Day   Substance Use Topics    Alcohol use: Yes    Drug use: Yes     Types: Marijuana

## 2023-07-16 ENCOUNTER — Other Ambulatory Visit (INDEPENDENT_AMBULATORY_CARE_PROVIDER_SITE_OTHER): Payer: Self-pay | Admitting: Physician Assistant

## 2023-07-16 NOTE — Telephone Encounter (Signed)
 Medication(s) requested: estarylla   Medication(s) last refilled: 04/24/2023, #84  Last visit for this medication: 08/17/2022  Due for follow-up:  Upcoming appt and reason:

## 2023-08-26 ENCOUNTER — Encounter (INDEPENDENT_AMBULATORY_CARE_PROVIDER_SITE_OTHER): Payer: Self-pay | Admitting: Physician Assistant

## 2023-08-26 ENCOUNTER — Ambulatory Visit (INDEPENDENT_AMBULATORY_CARE_PROVIDER_SITE_OTHER): Admitting: Physician Assistant

## 2023-08-26 VITALS — BP 138/84 | HR 91 | Temp 98.2°F | Ht 68.0 in | Wt 259.0 lb

## 2023-08-26 DIAGNOSIS — L309 Dermatitis, unspecified: Secondary | ICD-10-CM

## 2023-08-26 DIAGNOSIS — F4320 Adjustment disorder, unspecified: Secondary | ICD-10-CM

## 2023-08-26 DIAGNOSIS — Z0000 Encounter for general adult medical examination without abnormal findings: Secondary | ICD-10-CM

## 2023-08-26 DIAGNOSIS — F419 Anxiety disorder, unspecified: Secondary | ICD-10-CM

## 2023-08-26 DIAGNOSIS — F331 Major depressive disorder, recurrent, moderate: Secondary | ICD-10-CM

## 2023-08-26 LAB — LAB USE ONLY - CBC WITH DIFFERENTIAL
Absolute Basophils: 0.03 x10 3/uL (ref 0.00–0.08)
Absolute Eosinophils: 0 x10 3/uL (ref 0.00–0.44)
Absolute Immature Granulocytes: 0.02 x10 3/uL (ref 0.00–0.07)
Absolute Lymphocytes: 2.11 x10 3/uL (ref 0.42–3.22)
Absolute Monocytes: 0.55 x10 3/uL (ref 0.21–0.85)
Absolute Neutrophils: 5.9 x10 3/uL (ref 1.10–6.33)
Absolute nRBC: 0 x10 3/uL (ref <=0.00)
Basophils %: 0.3 %
Eosinophils %: 0 %
Hematocrit: 40.1 % (ref 34.7–43.7)
Hemoglobin: 13.3 g/dL (ref 11.4–14.8)
Immature Granulocytes %: 0.2 %
Lymphocytes %: 24.5 %
MCH: 30.7 pg (ref 25.1–33.5)
MCHC: 33.2 g/dL (ref 31.5–35.8)
MCV: 92.6 fL (ref 78.0–96.0)
MPV: 9.8 fL (ref 8.9–12.5)
Monocytes %: 6.4 %
Neutrophils %: 68.6 %
Platelet Count: 317 x10 3/uL (ref 142–346)
Preliminary Absolute Neutrophil Count: 5.9 x10 3/uL (ref 1.10–6.33)
RBC: 4.33 x10 6/uL (ref 3.90–5.10)
RDW: 12 % (ref 11–15)
WBC: 8.61 x10 3/uL (ref 3.10–9.50)
nRBC %: 0 /100{WBCs} (ref <=0.0)

## 2023-08-26 LAB — COMPREHENSIVE METABOLIC PANEL
ALT: 10 U/L (ref <=55)
AST (SGOT): 22 U/L (ref <=41)
Albumin/Globulin Ratio: 1.1 (ref 0.9–2.2)
Albumin: 3.8 g/dL (ref 3.5–5.0)
Alkaline Phosphatase: 64 U/L (ref 37–117)
Anion Gap: 13 (ref 5.0–15.0)
BUN: 8 mg/dL (ref 7–21)
Bilirubin, Total: 0.5 mg/dL (ref 0.2–1.2)
CO2: 23 meq/L (ref 17–29)
Calcium: 9 mg/dL (ref 8.5–10.5)
Chloride: 103 meq/L (ref 99–111)
Creatinine: 0.9 mg/dL (ref 0.4–1.0)
GFR: >60 mL/min/1.73 m2 (ref >=60.0)
Globulin: 3.4 g/dL (ref 2.0–3.6)
Glucose: 75 mg/dL (ref 70–100)
Hemolysis Index: 11 {index}
Potassium: 3.7 meq/L (ref 3.5–5.3)
Protein, Total: 7.2 g/dL (ref 6.0–8.3)
Sodium: 139 meq/L (ref 135–145)

## 2023-08-26 LAB — LIPID PANEL
Cholesterol / HDL Ratio: 3.3 {index}
Cholesterol: 205 mg/dL — ABNORMAL HIGH (ref <=199)
HDL: 63 mg/dL (ref >=40)
LDL Calculated: 123 mg/dL — ABNORMAL HIGH (ref 0–99)
Triglycerides: 93 mg/dL (ref 34–149)
VLDL Calculated: 19 mg/dL (ref 10–40)

## 2023-08-26 LAB — SEDIMENTATION RATE: Sed Rate: 14 mm/h (ref <=20)

## 2023-08-26 LAB — HEMOGLOBIN A1C
Average Estimated Glucose: 93.9 mg/dL
Hemoglobin A1C: 4.9 % (ref 4.6–5.6)

## 2023-08-26 LAB — TSH: TSH: 1.35 u[IU]/mL (ref 0.35–4.94)

## 2023-08-26 LAB — C-REACTIVE PROTEIN: C-Reactive Protein: 1.4 mg/dL — ABNORMAL HIGH (ref 0.0–1.1)

## 2023-08-26 MED ORDER — NORGESTIMATE-ETH ESTRADIOL 0.25-35 MG-MCG PO TABS
1.0000 | ORAL_TABLET | Freq: Every day | ORAL | 3 refills | Status: AC
Start: 2023-08-26 — End: ?

## 2023-08-26 MED ORDER — BUPROPION HCL ER (XL) 150 MG PO TB24
150.0000 mg | ORAL_TABLET | Freq: Every day | ORAL | 3 refills | Status: AC
Start: 2023-08-26 — End: ?

## 2023-08-26 NOTE — Progress Notes (Signed)
 [facility]                   Date of Exam: 08/26/2023 9:32 AM        Patient ID: Yolanda Jenkins is a 33 y.o. female.  Attending Physician: Laymon LITTIE Crews, PA        Chief Complaint:    Chief Complaint   Patient presents with    Annual Exam     fbw             HPI:    HPI  Visit Type: Health Maintenance Visit  Work Status: working full-time  Reported Health: good health  Reported Diet: compliant with well-balanced diet  Caffeine use- soda per day  Alcohol use- occasionally  Reported Exercise: none  Dental: dentist visit > 1 year ago  Vision: no  Hearing: normal hearing  Immunization Status: immunizations up to date  Reproductive Health: menses is regular  Last pap smear 2021  Prior Screening Tests: no hx of abnormal pap smear.     Depression- grief reaction she has been on depression, anxiety med in the past, lexapro , but she states it caused her to have no appetite.  She has been on Effexor and zoloft  as well  She is not currently doing therapy, she has thought about therapy.    General Health Risks: no family history of colon cancer and no family history of breast cancer  Safety Elements Used: uses seat belts, smoke detectors in household, carbon monoxide detectors in household, and does not text and drive  J8r trend:   Lab Results   Component Value Date    HGBA1C 4.9 08/17/2022    HGBA1C 4.9 04/14/2021     FLP trend:   Lab Results   Component Value Date    CHOL 194 08/17/2022    CHOL 210 (H) 04/14/2021    TRIG 101 08/17/2022    TRIG 91 04/14/2021    HDL 58 08/17/2022    HDL 54 04/14/2021    LDL 116 08/17/2022    LDL 861 (H) 04/14/2021             Problem List:    Problem List[1]          Current Meds:    Medications Taking[2]       Allergies:    Allergies[3]        Past Surgical History:    Past Surgical History[4]        Family History:    Family History[5]        Social History:    Social History[6]        The following sections were reviewed this encounter by the provider:  Meds               Vital Signs:    BP 138/84 (BP Site: Left arm, Patient Position: Sitting)   Pulse 91   Temp 98.2 F (36.8 C)   Ht 1.727 m (5' 8)   Wt 117.5 kg (259 lb)   LMP 08/13/2023   SpO2 98%   BMI 39.38 kg/m          ROS:    Review of Systems   General/Constitutional:   Denies Change in appetite. Denies Chills. Denies Fatigue. Denies Fever.  Ophthalmologic:   Denies Blurred vision.  ENT:   Denies Ear pain. Denies Hearing Loss. Denies Sinus pain. Denies Nasal Discharge. Denies Sore throat. Denies Hoarseness.  Cardiovascular:   Denies Chest pain. Denies  Palpitations. Denies Swelling.  Respiratory:   Denies Shortness of breath. Denies Difficulty breathing. Denies Cough. Denies Wheezing. Denies Pain with respiration.   Gastrointestinal:   Denies Abdominal pain. Denies Heartburn. Denies Nausea. Denies Vomiting. Denies Constipation. Denies Diarrhea. Denies Bright red blood per rectum. Denies Melena.   Genitourinary:   Denies Genitourinary symptoms.  Musculoskeletal:   Denies Arthralgias. Denies Joint swelling. Denies Joint stiffness. Denies Myalgias. Denies Back pain.  Peripheral Vascular:   Denies Cold extremities.  Skin:   Denies Itching. Denies Change in Mole(s). + Rash.   Neurologic:   Denies Neurologic symptoms. Denies Headache. Denies Dizziness. Denies Lightheadedness. Denies Tingling. Denies Weakness.  Hematology:   Denies Easy bruising. Denies Easy Bleeding.   Endocrine:   Denies Polydipsia. Denies Polyuria. Denies Menstrual problems. Denies PMDD symptoms.   Psychiatric:   Anxiety. +Depressed mood. Denies Mood Swings. Denies Difficulty sleeping.         Physical Exam:    Physical Exam   GENERAL APPEARANCE: alert, in no acute distress, well developed, well nourished, oriented to time, place, and person.   HEAD: normal appearance, atraumatic.   EYES: extraocular movement intact (EOMI), pupils equal, round, reactive to light and accommodation, sclera anicteric, conjunctiva clear, no  discharge.  EARS: normal hearing, normal external auditory canals, normal tympanic membranes.   NOSE: normal nasal mucosa, no lesions.   ORAL CAVITY: normal lips, normal oral mucosa, normal oropharynx, no lesions.   THROAT: normal appearance, clear, no erythema.   NECK/THYROID: FROM, neck supple, no cervical lymphadenopathy, no thyromegaly, no neck mass palpated, no carotid bruit, no jugular venous distention.   LYMPH NODES: no palpable adenopathy.   SKIN: no rashes, no suspicious lesions.   HEART: regular rate and rhythm, no murmurs, rubs, gallops.   LUNGS: normal effort / no distress, normal breath sounds, clear to auscultation bilaterally, no wheezes, rales, rhonchi.   ABDOMEN: bowel sounds normal, soft, non-distended, non-tender, no hepatosplenomegaly.   MUSCULOSKELETAL: full range of motion, no swelling or deformity.   EXTREMITIES: no clubbing, cyanosis, or edema.   PERIPHERAL PULSES: 2+ peripheral pulses.   NEUROLOGIC: normal gait, cranial nerves 2-12 grossly intact, normal strength, tone, sensation, and reflexes.  PSYCH: normal affect, normal mood, cognitive function intact, speech clear.         Assessment:    1. Well female exam without gynecological exam  - CBC with Differential (Order); Future  - Hemoglobin A1C; Future  - TSH; Future  - Comprehensive Metabolic Panel; Future  - Lipid Panel; Future  - CBC with Differential (Order)  - Hemoglobin A1C  - TSH  - Comprehensive Metabolic Panel  - Lipid Panel    2. Dermatitis  - Sedimentation Rate (ESR); Future  - C Reactive Protein; Future  - ANA IFA with Reflex to Titer/Pattern/Antibody; Future  - Sedimentation Rate (ESR)  - C Reactive Protein  - ANA IFA with Reflex to Titer/Pattern/Antibody    3. Moderate episode of recurrent major depressive disorder (CMS/HCC)  - buPROPion XL (Wellbutrin XL) 150 MG 24 hr tablet; Take 1 tablet (150 mg) by mouth once daily  Dispense: 30 tablet; Refill: 3  - Referral to Adult Psychiatry & Behavioral Health ([place]); Future    4.  Anxiety  - Referral to Adult Psychiatry & Behavioral Health ([place]); Future    5. Grief reaction  - Referral to Adult Psychiatry & Behavioral Health ([place]); Future            Plan:    Health Maintenance:  Wellness Screening  Patient  scored (!) (Patient-Rptd) 17 and is high (15+) on the depression screening.   Patient scored low on the suicide risk assessment.  Patient's treatment plan includes see office visit documentation for management details and new referral to behavioral health specialist.  Patient to start on wellbutrin once daily for depression symptoms.  Discussed outpatient resources. She will call if any new or worsening symptoms.      She will follow up for pap smear.   Counseled on healthy lifestyle. F/U based on results.          Follow-up:    No follow-ups on file.         Laymon CROME Orissa Arreaga, PA             [1]   Patient Active Problem List  Diagnosis    Allergic rhinitis    Common migraine with intractable migraine    Dysmenorrhea    Obesity    Vitamin D deficiency    Abnormal weight gain    GAD (generalized anxiety disorder)   [2]   Outpatient Medications Marked as Taking for the 08/26/23 encounter (Office Visit) with Priscella Laymon CROME, PA   Medication Sig Dispense Refill    [DISCONTINUED] norgestimate -ethinyl estradiol  (Estarylla ) 0.25-35 MG-MCG per tablet Take 1 tablet by mouth daily 84 tablet 0   [3]   Allergies  Allergen Reactions    Oseltamivir Chest Pain and Shortness Of Breath   [4] History reviewed. No pertinent surgical history.  [5]   Family History  Problem Relation Name Age of Onset    Hypertension Mother      Diabetes Mother     [6]   Social History  Tobacco Use    Smoking status: Never    Smokeless tobacco: Never   Vaping Use    Vaping status: Every Day   Substance Use Topics    Alcohol use: Yes    Drug use: Yes     Types: Marijuana

## 2023-08-27 ENCOUNTER — Encounter (INDEPENDENT_AMBULATORY_CARE_PROVIDER_SITE_OTHER): Payer: Self-pay

## 2023-08-27 LAB — ANA IFA WITH REFLEX TO TITER/PATTERN/ANTIBODY
ANA Screen: POSITIVE — AB
ANA Titer #1: 1:80 {titer} — AB

## 2023-08-27 LAB — LAB USE ONLY - ANA PATHOLOGY REVIEW
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
## 2023-08-28 LAB — EXTRACTABLE NUCLEAR ANTIGEN ANTIBODY
Centromere Antibody Interpretation: NEGATIVE
Centromere Antibody: 6 U (ref <=19)
Chromatin Antibody Interpretation: NEGATIVE
Chromatin Antibody: 5 U (ref <=19)
Double Stranded DNA (dsDNA) Antibody: 30 [IU]/mL (ref <=200)
Double Stranded DNA(dsDNA)Antibody Interpretation: NEGATIVE
Histone Antibody Interpretation: NEGATIVE
Histone Antibody: 0.5 U (ref <=0.9)
Jo-1 Antibody Interpretation: NEGATIVE
Jo-1 Antibody: 3 U (ref <=19)
RNA Polymerase III Antibody Interpretation: NEGATIVE
RNA Polymerase III Antibody: 10 U (ref <=19)
Ribonucleoprotein (RNP) Antibody Interpretation: NEGATIVE
Ribonucleoprotein (RNP) Antibody: 4 U (ref <=19)
Ribosomal P Antibody Interpretation: NEGATIVE
Ribosomal P Antibody: 2 U (ref <=19)
Scleroderma (Scl-70) Antibody Interpretation: NEGATIVE
Scleroderma (Scl-70) Antibody: 7 U (ref <=19)
Sjogrens SSA (Ro) Antibody Interpretation: NEGATIVE
Sjogrens SSA (Ro) Antibody: 2 U (ref <=19)
Sjogrens SSB (La) Antibody Interpretation: NEGATIVE
Sjogrens SSB (La) Antibody: 3 U (ref <=19)
Smith (Sm) Antibody Interpretation: NEGATIVE
Smith (Sm) Antibody: 4 U (ref <=19)

## 2023-08-29 ENCOUNTER — Telehealth (HOSPITAL_BASED_OUTPATIENT_CLINIC_OR_DEPARTMENT_OTHER): Payer: Self-pay

## 2023-08-29 ENCOUNTER — Ambulatory Visit (INDEPENDENT_AMBULATORY_CARE_PROVIDER_SITE_OTHER): Admitting: Professional

## 2023-08-29 ENCOUNTER — Telehealth (INDEPENDENT_AMBULATORY_CARE_PROVIDER_SITE_OTHER): Payer: Self-pay | Admitting: Professional

## 2023-08-29 NOTE — Telephone Encounter (Signed)
 pt called at 9:45am i am at work and i am un able to get off for the session. I will call back to re-schedule

## 2023-08-29 NOTE — Telephone Encounter (Signed)
 Telephone Encounter NoteCorporate treasurer received notice pt needed to cancel new ind therapy appt today due to work conflict. Pt completed phq9 screener prior to appt and indicated positive phq9 for q#9--writer contacted pt to discuss any current safety concerns. Writer spoke with pt and pt denied suicidal intention or plans. Writer reminded pt of resources: Contacting 911, going to nearest ED and seeking immediate support. Pt agreeable. Writer will also send pt mychart msg with these resources.  Yolanda Jenkins, LPC

## 2023-09-01 ENCOUNTER — Ambulatory Visit (INDEPENDENT_AMBULATORY_CARE_PROVIDER_SITE_OTHER): Payer: Self-pay | Admitting: Physician Assistant

## 2023-09-01 DIAGNOSIS — R768 Other specified abnormal immunological findings in serum: Secondary | ICD-10-CM

## 2023-09-01 DIAGNOSIS — L309 Dermatitis, unspecified: Secondary | ICD-10-CM

## 2023-09-18 ENCOUNTER — Encounter (INDEPENDENT_AMBULATORY_CARE_PROVIDER_SITE_OTHER): Payer: Self-pay

## 2023-09-23 ENCOUNTER — Telehealth (INDEPENDENT_AMBULATORY_CARE_PROVIDER_SITE_OTHER): Admitting: Internal Medicine

## 2023-09-23 ENCOUNTER — Encounter (INDEPENDENT_AMBULATORY_CARE_PROVIDER_SITE_OTHER): Payer: Self-pay | Admitting: Internal Medicine

## 2023-09-23 VITALS — Ht 68.0 in | Wt 250.0 lb

## 2023-09-23 DIAGNOSIS — L309 Dermatitis, unspecified: Secondary | ICD-10-CM

## 2023-09-23 DIAGNOSIS — R61 Generalized hyperhidrosis: Secondary | ICD-10-CM

## 2023-09-23 DIAGNOSIS — R768 Other specified abnormal immunological findings in serum: Secondary | ICD-10-CM

## 2023-09-23 DIAGNOSIS — R29898 Other symptoms and signs involving the musculoskeletal system: Secondary | ICD-10-CM

## 2023-09-23 DIAGNOSIS — R634 Abnormal weight loss: Secondary | ICD-10-CM

## 2023-09-23 NOTE — Patient Instructions (Signed)
 Dear Maronda K Drabik ,     Thanks for arranging a video visit with me.    VISIT SUMMARY:  Today, you were seen for a new patient evaluation due to a positive ANA test and joint pain. We discussed your symptoms, including a persistent rash on your feet, night sweats, and unintentional weight loss. We reviewed your medical history, current medications, and lifestyle factors.    YOUR PLAN:  -NIGHT SWEATS: Night sweats can be caused by various conditions, including infections and medications. Since your symptoms predate your Wellbutrin  use, we will order a chest x-ray to rule out chronic infections or other causes.    -UNINTENTIONAL WEIGHT LOSS: Unintentional weight loss can be a sign of underlying health issues. To investigate further, we will order lab tests for chronic infections such as TB, hepatitis B and C, HIV, and syphilis.    -POSITIVE ANA: A positive ANA test can indicate an autoimmune condition, but it is not definitive on its own. Given your night sweats and weight loss, we will order additional lab tests for muscle inflammation and rare autoimmune muscle conditions.    -ECZEMA: Eczema is a skin condition that causes dry, cracked, and painful skin. Since topical steroids have been ineffective, you will continue with the bi-weekly injections as planned by your dermatologist. We will monitor your response to this new treatment.    INSTRUCTIONS:  Please follow up with the lab tests and chest x-ray as ordered. Continue your treatment with the dermatologist and monitor your symptoms. Schedule a follow-up appointment to review the results and discuss the next steps.            You may retrieve any orders from this visit in Mychart by using these steps:    After signing into Mychart  1) click Messaging  2) click Letters  3) select the Requisition  4) Review and click print    Please contact the clinic at [phone] with any questions and to schedule your follow up visit.    Sincerely,    Geni Derby,  MD  Rheumatologist  [facility]  [address]  [place], [place] [address]  Phone: [phone]  Fax: [phone]

## 2023-09-23 NOTE — Progress Notes (Signed)
 RHEUMATOLOGY NEW PATIENT TELEMEDICINE NOTE    Telemedicine Documentation Requirements  Originating site (Patient location): home  Distant site (Provider location): Office  Provider and Title: Dr. Woodard  Type of Visit: Video Visit  Verbal Consent has been obtained to conduct a telemedicine visit on this date in order to minimize exposure to COVID-19.        Chief Complaint   Patient presents with    Consult (Initial)     Referred by Priscella Laymon CROME, PA    Positive Ana     Labs done on: 6/30    Rash     Pt saw Dermatologist today, [facility] for rash on bottoms of B/L feet    Joint Pain     C/o joint pain, prominently of bilateral knees, Lt knee is worse than Rt       PCP: Priscella Laymon CROME, PA    HPI:    History of Present Illness  Yolanda Jenkins is a 33 year old female who presents for a new patient evaluation for positive ANA and joint pain. She was referred by her podiatrist for evaluation of possible autoimmune conditions.    She has a positive ANA test with a fine speckled pattern, but all other specific antibodies for autoimmune conditions were negative. Her ANA immunofluorescence was 1:80 speckled. She had a CRP of 1.4 and an ESR of 14. Her comprehensive metabolic panel and CBC were within normal limits.    She experiences a persistent rash on both feet, which her dermatologist told her was eczema, and it has not responded well to topical steroids. She is starting a new treatment involving injections every two weeks. The rash causes her skin to become tough and split, leading to pain. She does not experience joint pain or swelling in the morning but reports dry, peeling, and sometimes bleeding fingertips.    She experiences night sweats several times a week, which she initially attributed to Wellbutrin , but notes that they occurred prior to starting the medication. She has unintentionally lost 40 pounds over the last two years without trying. No fevers, chills, or unexplained  fatigue beyond what she attributes to lifestyle factors.    She reports occasional diarrhea but denies any blood in the stool, chest pain, shortness of breath, or changes in vision or hearing. She has no history of blood clots, seizures, or strokes. No numbness or tingling in her extremities.  She reports some lwoer leg weakness when exiting a car.  No joint pain or swelling except for occasional knee stiffness.    Her current medications include Wellbutrin , birth control, and topical creams for her skin condition. She occasionally uses ibuprofen for pain relief.    She lives with her mother and brother and works as a Teacher, English as a foreign language. She smokes marijuana once or twice a week and drinks alcohol occasionally. There is no family history of lupus or rheumatoid arthritis, but there is a history of cancer in some family members.    Results  LABS    ANA: 1:80 speckled (08/26/2023)    CRP: 1.4 (08/26/2023)    ESR: 14 (08/26/2023)    Total cholesterol: 205 (08/26/2023)    Triglycerides: 93 (08/26/2023)    HDL: 63 (08/26/2023)    Glucose: 75 (08/26/2023)    Creatinine: 0.9 (08/26/2023)    AST: 22 (08/26/2023)    ALT: 10 (08/26/2023)    Alkaline phosphatase: 64 (08/26/2023)    TSH: 1.35 (08/26/2023)    Hemoglobin A1c: 4.9 (08/26/2023)  WBC: 8.61 (08/26/2023)    Hemoglobin: 13.3 (08/26/2023)    Hematocrit: 40.1 (08/26/2023)    Platelet count: 317 (08/26/2023)             The following sections were reviewed this encounter by the provider:        PMH/PSH:  Medical History[1]     Past Surgical History[2]     FH/SH:  Family History[3]    Social History[4]     Meds/ Allergies:  Medications Taking[5]  Allergies[6]       PHYSICAL EXAM  General- WNWD, alert and oriented, NAD  Eyes- EOMI, PERRL, no conjunctival injection, no scleral icterus  ENMT- face symmetric without lesions  Skin- no rash, no alopecia  Pulm: No conversational dyspnea, breathing comfortable on room air  Neuro - no notable neurological deficits, no facial  asymmetry          LABS:  Lab Results   Component Value Date    WBC 8.61 08/26/2023    HGB 13.3 08/26/2023    HCT 40.1 08/26/2023    MCV 92.6 08/26/2023    PLT 317 08/26/2023      Lab Results   Component Value Date    CREAT 0.9 08/26/2023    BUN 8 08/26/2023    NA 139 08/26/2023    K 3.7 08/26/2023    CL 103 08/26/2023    CO2 23 08/26/2023      Lab Results   Component Value Date    ALT 10 08/26/2023    AST 22 08/26/2023    ALKPHOS 64 08/26/2023    BILITOTAL 0.5 08/26/2023     Lab Results   Component Value Date    ESR 14 08/26/2023      Lab Results   Component Value Date    CRP 1.4 (H) 08/26/2023      Lab Results   Component Value Date    ANA 1:80 (A) 08/26/2023    ANA Speckled (A) 08/26/2023    ANA Negative 08/26/2023    ANA 3 08/26/2023    ANA Negative 08/26/2023    ANA 30 08/26/2023    ANA Negative 08/26/2023    ANA 6 08/26/2023    ANA Negative 08/26/2023    ANA 0.5 08/26/2023    ANA Negative 08/26/2023    ANA 5 08/26/2023    ANA Negative 08/26/2023    ANA 2 08/26/2023    ANA Negative 08/26/2023    ANA 10 08/26/2023    ANA Negative 08/26/2023    ANA 4 08/26/2023    ANA Negative 08/26/2023    ANA 4 08/26/2023    ANA Negative 08/26/2023    ANA 7 08/26/2023    ANA  08/26/2023     Pathologist's Review of FANA Pattern:  Fine Speckled (AC-4).    Uniform, weak fine discrete speckled staining with negative nucleoli and negative chromosomal stain. This pattern can be present in limited cutaneous SS-CREST (calcinosis, Raynaud phenomenon, esophageal dysmotility, sclerodactyly, and telangiectasia), Sjogren's Syndrome, SLE, Dermatomyositis, etc. Recommend correlation with ENA tests, if clinically indicated.     Electronically reviewed and signed: Kennie Lash, M.D.         No results found for: RHEUMFACTOR   No results found for: CCP, CCPANTIBODYI  No results found for: URICACID        ASSESSMENT/PLAN:    Assessment & Plan  Night sweats  Severe night sweats several times a week. Wellbutrin  may contribute, but symptoms  predate medication. No fever, chills, or cough.  -  Order chest x-ray to rule out chronic infections or other causes.    Unintentional weight loss  40-pound weight loss over two years without trying. No fever, chills, or cough. Differential includes chronic infection or systemic conditions.  - Order lab tests for chronic infections: TB, hepatitis B and C, HIV, syphilis.  - PCP may need to consider Gi referral and malignancy rule out with other imaging    Positive ANA  ANA positive 1:80 speckled pattern. Negative for lupus, Sjogren's, myositis specific markers. Positive ANA may not be significant, but further evaluation due to night sweats and weight loss.  Would consider chronic ifnections and this can also be positive in malignancy-this should be considered by ehr PCP  - Order lab tests for muscle inflammation and markers of active inflammation.  Will check myositis specific panel (mechanic's hands/hikers feet?-will assess on in person isit)  - Order panel for rare autoimmune muscle conditions.    Eczema  Eczema on feet with dry, cracked skin. Topical steroids ineffective. Dermatologist plans bi-weekly injections.  - Continue treatment with dermatologist.            1. Positive ANA (antinuclear antibody)  - Referral to Rheumatology (EXTERNAL)  - XR Chest 2 Views; Future  - CBC with Differential (Order); Future  - Comprehensive Metabolic Panel; Future  - Sedimentation Rate (ESR); Future  - C Reactive Protein; Future  - DNA(ds) Antibody, Crithidia IFA with Reflex to Titer; Future  - C4 Complement; Future  - C3 Complement; Future  - Creatine Kinase (CK); Future  - Hepatitis B Core Antibody, Total; Future  - Hepatitis B Surface (HBV) Antibody, Quantitative; Future  - Hepatitis B (HBV) Surface Antigen with Reflex to Confirmation; Future  - Hepatitis C Antibody, Total; Future  - Gold SST Hold Tube; Future  - Quantiferon(R) - TB Gold Plus; Future  - HIV-1/2, Antigen and Antibody with Reflex to Confirmation; Future  -  Lavender - EDTA Hold Tube; Future  - Gold SST Hold Tube; Future  - Syphilis Screen, IgG and IgM; Future  - Miscellaneous Lab Test - Myomarker 3 profile; Future  - Urinalysis with Microscopic Exam; Future  - Urine Protein/Creatinine Ratio; Future    2. Dermatitis  - Referral to Rheumatology (EXTERNAL)  - XR Chest 2 Views; Future  - CBC with Differential (Order); Future  - Comprehensive Metabolic Panel; Future  - Sedimentation Rate (ESR); Future  - C Reactive Protein; Future  - DNA(ds) Antibody, Crithidia IFA with Reflex to Titer; Future  - C4 Complement; Future  - C3 Complement; Future  - Creatine Kinase (CK); Future  - Hepatitis B Core Antibody, Total; Future  - Hepatitis B Surface (HBV) Antibody, Quantitative; Future  - Hepatitis B (HBV) Surface Antigen with Reflex to Confirmation; Future  - Hepatitis C Antibody, Total; Future  - Gold SST Hold Tube; Future  - Quantiferon(R) - TB Gold Plus; Future  - HIV-1/2, Antigen and Antibody with Reflex to Confirmation; Future  - Lavender - EDTA Hold Tube; Future  - Gold SST Hold Tube; Future  - Syphilis Screen, IgG and IgM; Future  - Miscellaneous Lab Test - Myomarker 3 profile; Future  - Urinalysis with Microscopic Exam; Future  - Urine Protein/Creatinine Ratio; Future    3. Night sweats  - XR Chest 2 Views; Future  - CBC with Differential (Order); Future  - Comprehensive Metabolic Panel; Future  - Sedimentation Rate (ESR); Future  - C Reactive Protein; Future  - DNA(ds) Antibody, Crithidia IFA with Reflex to Titer; Future  -  C4 Complement; Future  - C3 Complement; Future  - Creatine Kinase (CK); Future  - Hepatitis B Core Antibody, Total; Future  - Hepatitis B Surface (HBV) Antibody, Quantitative; Future  - Hepatitis B (HBV) Surface Antigen with Reflex to Confirmation; Future  - Hepatitis C Antibody, Total; Future  - Gold SST Hold Tube; Future  - Quantiferon(R) - TB Gold Plus; Future  - HIV-1/2, Antigen and Antibody with Reflex to Confirmation; Future  - Lavender - EDTA Hold  Tube; Future  - Gold SST Hold Tube; Future  - Syphilis Screen, IgG and IgM; Future  - Miscellaneous Lab Test - Myomarker 3 profile; Future  - Urinalysis with Microscopic Exam; Future  - Urine Protein/Creatinine Ratio; Future    4. Unintentional weight loss  - XR Chest 2 Views; Future  - CBC with Differential (Order); Future  - Comprehensive Metabolic Panel; Future  - Sedimentation Rate (ESR); Future  - C Reactive Protein; Future  - DNA(ds) Antibody, Crithidia IFA with Reflex to Titer; Future  - C4 Complement; Future  - C3 Complement; Future  - Creatine Kinase (CK); Future  - Hepatitis B Core Antibody, Total; Future  - Hepatitis B Surface (HBV) Antibody, Quantitative; Future  - Hepatitis B (HBV) Surface Antigen with Reflex to Confirmation; Future  - Hepatitis C Antibody, Total; Future  - Gold SST Hold Tube; Future  - Quantiferon(R) - TB Gold Plus; Future  - HIV-1/2, Antigen and Antibody with Reflex to Confirmation; Future  - Lavender - EDTA Hold Tube; Future  - Gold SST Hold Tube; Future  - Syphilis Screen, IgG and IgM; Future  - Miscellaneous Lab Test - Myomarker 3 profile; Future  - Urinalysis with Microscopic Exam; Future  - Urine Protein/Creatinine Ratio; Future    5. Leg weakness, bilateral  - XR Chest 2 Views; Future  - CBC with Differential (Order); Future  - Comprehensive Metabolic Panel; Future  - Sedimentation Rate (ESR); Future  - C Reactive Protein; Future  - DNA(ds) Antibody, Crithidia IFA with Reflex to Titer; Future  - C4 Complement; Future  - C3 Complement; Future  - Creatine Kinase (CK); Future  - Hepatitis B Core Antibody, Total; Future  - Hepatitis B Surface (HBV) Antibody, Quantitative; Future  - Hepatitis B (HBV) Surface Antigen with Reflex to Confirmation; Future  - Hepatitis C Antibody, Total; Future  - Gold SST Hold Tube; Future  - Quantiferon(R) - TB Gold Plus; Future  - HIV-1/2, Antigen and Antibody with Reflex to Confirmation; Future  - Lavender - EDTA Hold Tube; Future  - Gold SST Hold  Tube; Future  - Syphilis Screen, IgG and IgM; Future  - Miscellaneous Lab Test - Myomarker 3 profile; Future  - Urinalysis with Microscopic Exam; Future  - Urine Protein/Creatinine Ratio; Future           FOLLOW UP:  Return in about 4 weeks (around 10/21/2023) for 4-6 weeks in person.          Geni Derby, MD  Rheumatologist  [facility]  [address]  [place], [place] [address]  Phone: [phone]  Fax: [phone]                             [1] History reviewed. No pertinent past medical history.  [2] History reviewed. No pertinent surgical history.  [3]   Family History  Problem Relation Name Age of Onset    Hypertension Mother      Diabetes Mother     [  4]   Social History  Tobacco Use    Smoking status: Never     Passive exposure: Never    Smokeless tobacco: Never   Vaping Use    Vaping status: Never Used   Substance Use Topics    Alcohol use: Yes     Comment: occasionally    Drug use: Yes     Types: Marijuana   [5]   Outpatient Medications Marked as Taking for the 09/23/23 encounter (Telemedicine Visit) with Woodard Geni HERO, MD   Medication Sig Dispense Refill    buPROPion  XL (Wellbutrin  XL) 150 MG 24 hr tablet Take 1 tablet (150 mg) by mouth once daily 30 tablet 3    clobetasol (TEMOVATE) 0.05 % cream Apply topically as needed      Naftifine HCl 2 % Cream as needed      norgestimate -ethinyl estradiol  (Estarylla ) 0.25-35 MG-MCG per tablet Take 1 tablet by mouth once daily 84 tablet 3   [6]   Allergies  Allergen Reactions    Oseltamivir Chest Pain and Shortness Of Breath

## 2023-09-26 ENCOUNTER — Other Ambulatory Visit (INDEPENDENT_AMBULATORY_CARE_PROVIDER_SITE_OTHER)

## 2023-09-30 ENCOUNTER — Other Ambulatory Visit (FREE_STANDING_LABORATORY_FACILITY)

## 2023-09-30 DIAGNOSIS — R768 Other specified abnormal immunological findings in serum: Secondary | ICD-10-CM

## 2023-09-30 DIAGNOSIS — R61 Generalized hyperhidrosis: Secondary | ICD-10-CM

## 2023-09-30 DIAGNOSIS — L309 Dermatitis, unspecified: Secondary | ICD-10-CM

## 2023-09-30 DIAGNOSIS — R29898 Other symptoms and signs involving the musculoskeletal system: Secondary | ICD-10-CM

## 2023-09-30 DIAGNOSIS — R634 Abnormal weight loss: Secondary | ICD-10-CM

## 2023-09-30 LAB — COMPREHENSIVE METABOLIC PANEL
ALT: 11 U/L (ref <=55)
AST (SGOT): 16 U/L (ref <=41)
Albumin/Globulin Ratio: 1 (ref 0.9–2.2)
Albumin: 3.5 g/dL (ref 3.5–5.0)
Alkaline Phosphatase: 59 U/L (ref 37–117)
Anion Gap: 9 (ref 5.0–15.0)
BUN: 11 mg/dL (ref 7–21)
Bilirubin, Total: 0.2 mg/dL (ref 0.2–1.2)
CO2: 21 meq/L (ref 17–29)
Calcium: 9.1 mg/dL (ref 8.5–10.5)
Chloride: 108 meq/L (ref 99–111)
Creatinine: 0.9 mg/dL (ref 0.4–1.0)
GFR: >60 mL/min/1.73 m2 (ref >=60.0)
Globulin: 3.6 g/dL (ref 2.0–3.6)
Glucose: 77 mg/dL (ref 70–100)
Hemolysis Index: 0 {index}
Potassium: 3.8 meq/L (ref 3.5–5.3)
Protein, Total: 7.1 g/dL (ref 6.0–8.3)
Sodium: 138 meq/L (ref 135–145)

## 2023-09-30 LAB — LAB USE ONLY - CBC WITH DIFFERENTIAL
Absolute Basophils: 0.03 x10 3/uL (ref 0.00–0.08)
Absolute Eosinophils: 0.16 x10 3/uL (ref 0.00–0.44)
Absolute Immature Granulocytes: 0.02 x10 3/uL (ref 0.00–0.07)
Absolute Lymphocytes: 2.36 x10 3/uL (ref 0.42–3.22)
Absolute Monocytes: 0.42 x10 3/uL (ref 0.21–0.85)
Absolute Neutrophils: 3.65 x10 3/uL (ref 1.10–6.33)
Absolute nRBC: 0 x10 3/uL (ref <=0.00)
Basophils %: 0.5 %
Eosinophils %: 2.4 %
Hematocrit: 37 % (ref 34.7–43.7)
Hemoglobin: 12.6 g/dL (ref 11.4–14.8)
Immature Granulocytes %: 0.3 %
Lymphocytes %: 35.5 %
MCH: 30.5 pg (ref 25.1–33.5)
MCHC: 34.1 g/dL (ref 31.5–35.8)
MCV: 89.6 fL (ref 78.0–96.0)
MPV: 9.7 fL (ref 8.9–12.5)
Monocytes %: 6.3 %
Neutrophils %: 55 %
Platelet Count: 348 x10 3/uL — ABNORMAL HIGH (ref 142–346)
Preliminary Absolute Neutrophil Count: 3.65 x10 3/uL (ref 1.10–6.33)
RBC: 4.13 x10 6/uL (ref 3.90–5.10)
RDW: 12 % (ref 11–15)
WBC: 6.64 x10 3/uL (ref 3.10–9.50)
nRBC %: 0 /100{WBCs} (ref <=0.0)

## 2023-09-30 LAB — URINALYSIS WITH MICROSCOPIC EXAM
Urine Bilirubin: NEGATIVE
Urine Blood: NEGATIVE
Urine Glucose: NEGATIVE
Urine Ketones: NEGATIVE mg/dL
Urine Leukocyte Esterase: NEGATIVE
Urine Nitrite: NEGATIVE
Urine Protein: NEGATIVE
Urine Specific Gravity: 1.011 (ref 1.001–1.035)
Urine Urobilinogen: NORMAL mg/dL (ref 0.2–2.0)
Urine pH: 6 (ref 5.0–8.0)

## 2023-09-30 LAB — SYPHILIS SCREEN, IGG AND IGM: Syphilis Screen IgG and IgM: NONREACTIVE

## 2023-09-30 LAB — HEPATITIS B (HBV) SURFACE ANTIGEN WITH REFLEX TO CONFIRMATION: Hepatitis B Surface Antigen: NONREACTIVE

## 2023-09-30 LAB — HIV-1/2, ANTIGEN AND ANTIBODY WITH REFLEX TO CONFIRMATION: HIV Ag/Ab 4th Generation: NONREACTIVE

## 2023-09-30 LAB — CREATINE KINASE (CK): Creatine Kinase (CK): 63 U/L (ref 29–233)

## 2023-09-30 LAB — HEPATITIS B CORE ANTIBODY, TOTAL: Hepatitis B Core Total Antibody: NONREACTIVE

## 2023-09-30 LAB — LAB USE ONLY - GOLD SST HOLD TUBE

## 2023-09-30 LAB — HEPATITIS B SURFACE (HBV) ANTIBODY, QUANTITATIVE: Hepatitis B Surface Antibody: <3.31 m[IU]/mL

## 2023-09-30 LAB — URINE PROTEIN/CREATININE RATIO
Urine Creatinine: 70 mg/dL
Urine Protein: <7 mg/dL (ref 1.0–14.0)

## 2023-09-30 LAB — C-REACTIVE PROTEIN: C-Reactive Protein: 0.8 mg/dL (ref 0.0–1.1)

## 2023-09-30 LAB — C3 COMPLEMENT: C3 Complement: 144 mg/dL (ref 83–193)

## 2023-09-30 LAB — C4 COMPLEMENT: C4 Complement: 53 mg/dL (ref 15.0–57.0)

## 2023-09-30 LAB — SEDIMENTATION RATE: Sed Rate: 6 mm/h (ref <=20)

## 2023-09-30 LAB — HEPATITIS C ANTIBODY, TOTAL: Hepatitis C Antibody: NONREACTIVE

## 2023-09-30 LAB — LAB USE ONLY - LAVENDER - EDTA HOLD TUBE

## 2023-10-02 LAB — DNA(DS) ANTIBODY, CRITHIDIA IFA WITH REFLEX TO TITER: DNA Antibody (ds) Crithidia, IFA: NEGATIVE

## 2023-10-02 LAB — QUANTIFERON(R) - TB GOLD PLUS
Mitogen-NIL: 8.65 [IU]/mL
NIL: 0.01 [IU]/mL
Quantiferon TB Gold Plus: NEGATIVE
TB1-NIL: 0.01 [IU]/mL
TB2-NIL: 0.01 [IU]/mL

## 2023-10-11 LAB — MISCELLANEOUS LAB TEST
